# Patient Record
Sex: Female | Born: 1980 | Race: Asian | Hispanic: No | Marital: Married | Smoking: Never smoker
Health system: Southern US, Community
[De-identification: ages and names within clinical notes are randomized; demographics above are authoritative.]

## PROBLEM LIST (undated history)

## (undated) DIAGNOSIS — R87629 Unspecified abnormal cytological findings in specimens from vagina: Secondary | ICD-10-CM

## (undated) HISTORY — PX: NOSE SURGERY: SHX723

## (undated) HISTORY — PX: BREAST ENHANCEMENT SURGERY: SHX7

## (undated) HISTORY — DX: Unspecified abnormal cytological findings in specimens from vagina: R87.629

## (undated) HISTORY — PX: BREAST SURGERY: SHX581

---

## 2007-03-03 ENCOUNTER — Ambulatory Visit: Payer: Self-pay | Admitting: Nurse Practitioner

## 2007-03-03 DIAGNOSIS — J330 Polyp of nasal cavity: Secondary | ICD-10-CM

## 2007-04-04 ENCOUNTER — Ambulatory Visit: Payer: Self-pay | Admitting: Nurse Practitioner

## 2007-04-04 DIAGNOSIS — N3000 Acute cystitis without hematuria: Secondary | ICD-10-CM | POA: Insufficient documentation

## 2007-04-04 LAB — CONVERTED CEMR LAB
Blood in Urine, dipstick: NEGATIVE
Ketones, urine, test strip: NEGATIVE
Nitrite: NEGATIVE
Protein, U semiquant: NEGATIVE
Specific Gravity, Urine: 1.015
Urobilinogen, UA: 0.2
WBC Urine, dipstick: NEGATIVE

## 2007-04-05 ENCOUNTER — Encounter (INDEPENDENT_AMBULATORY_CARE_PROVIDER_SITE_OTHER): Payer: Self-pay | Admitting: Nurse Practitioner

## 2007-04-11 ENCOUNTER — Other Ambulatory Visit: Admission: RE | Admit: 2007-04-11 | Discharge: 2007-04-11 | Payer: Self-pay | Admitting: Family Medicine

## 2007-04-11 ENCOUNTER — Ambulatory Visit: Payer: Self-pay | Admitting: Nurse Practitioner

## 2007-04-11 DIAGNOSIS — J309 Allergic rhinitis, unspecified: Secondary | ICD-10-CM | POA: Insufficient documentation

## 2007-04-11 DIAGNOSIS — L708 Other acne: Secondary | ICD-10-CM | POA: Insufficient documentation

## 2007-04-11 LAB — CONVERTED CEMR LAB
Bilirubin Urine: NEGATIVE
Ketones, urine, test strip: NEGATIVE
Nitrite: NEGATIVE
Pap Smear: NORMAL
Protein, U semiquant: NEGATIVE
Specific Gravity, Urine: 1.015

## 2007-04-13 DIAGNOSIS — E7800 Pure hypercholesterolemia, unspecified: Secondary | ICD-10-CM

## 2007-04-13 LAB — CONVERTED CEMR LAB
ALT: 10 units/L (ref 0–35)
Alkaline Phosphatase: 65 units/L (ref 39–117)
BUN: 12 mg/dL (ref 6–23)
Basophils Relative: 1 % (ref 0–1)
Chloride: 101 meq/L (ref 96–112)
Cholesterol: 230 mg/dL — ABNORMAL HIGH (ref 0–200)
Creatinine, Ser: 0.69 mg/dL (ref 0.40–1.20)
HCT: 40.8 % (ref 36.0–46.0)
HDL: 83 mg/dL (ref >39)
Hemoglobin: 12.8 g/dL (ref 12.0–15.0)
LDL Cholesterol: 129 mg/dL — ABNORMAL HIGH (ref 0–99)
Lymphocytes Relative: 32 % (ref 12–46)
Lymphs Abs: 1.7 10*3/uL (ref 0.7–4.0)
MCHC: 31.4 g/dL (ref 30.0–36.0)
MCV: 86.1 fL (ref 78.0–100.0)
Neutro Abs: 2.8 10*3/uL (ref 1.7–7.7)
Neutrophils Relative %: 55 % (ref 43–77)
Potassium: 4.4 meq/L (ref 3.5–5.3)
RBC: 4.74 M/uL (ref 3.87–5.11)
TSH: 1.141 microintl units/mL (ref 0.350–5.50)
Total Bilirubin: 0.5 mg/dL (ref 0.3–1.2)
Triglycerides: 92 mg/dL (ref <150)
WBC: 5.2 10*3/uL (ref 4.0–10.5)

## 2007-04-19 ENCOUNTER — Encounter (INDEPENDENT_AMBULATORY_CARE_PROVIDER_SITE_OTHER): Payer: Self-pay | Admitting: Nurse Practitioner

## 2007-05-18 ENCOUNTER — Ambulatory Visit: Payer: Self-pay | Admitting: Nurse Practitioner

## 2007-05-18 LAB — CONVERTED CEMR LAB
Bilirubin Urine: NEGATIVE
Protein, U semiquant: NEGATIVE
pH: 7.5

## 2007-06-27 ENCOUNTER — Telehealth (INDEPENDENT_AMBULATORY_CARE_PROVIDER_SITE_OTHER): Payer: Self-pay | Admitting: Nurse Practitioner

## 2009-06-17 ENCOUNTER — Inpatient Hospital Stay (HOSPITAL_COMMUNITY): Admission: AD | Admit: 2009-06-17 | Discharge: 2009-06-17 | Payer: Self-pay | Admitting: Obstetrics and Gynecology

## 2009-08-13 ENCOUNTER — Inpatient Hospital Stay (HOSPITAL_COMMUNITY): Admission: AD | Admit: 2009-08-13 | Discharge: 2009-08-13 | Payer: Self-pay | Admitting: Obstetrics & Gynecology

## 2009-09-01 ENCOUNTER — Ambulatory Visit (HOSPITAL_COMMUNITY): Admission: RE | Admit: 2009-09-01 | Discharge: 2009-09-01 | Payer: Self-pay | Admitting: Family Medicine

## 2009-09-01 ENCOUNTER — Encounter: Payer: Self-pay | Admitting: Family Medicine

## 2009-09-20 ENCOUNTER — Inpatient Hospital Stay (HOSPITAL_COMMUNITY): Admission: AD | Admit: 2009-09-20 | Discharge: 2009-09-20 | Payer: Self-pay | Admitting: Obstetrics and Gynecology

## 2010-02-04 ENCOUNTER — Ambulatory Visit: Payer: Self-pay | Admitting: Obstetrics and Gynecology

## 2010-02-08 ENCOUNTER — Ambulatory Visit: Payer: Self-pay | Admitting: Obstetrics and Gynecology

## 2010-02-08 ENCOUNTER — Inpatient Hospital Stay (HOSPITAL_COMMUNITY): Admission: AD | Admit: 2010-02-08 | Discharge: 2010-02-10 | Payer: Self-pay | Admitting: Obstetrics & Gynecology

## 2010-06-09 NOTE — Progress Notes (Signed)
Summary: ENT referral  Phone Note Call from Patient   Reason for Call: Referral Summary of Call: Pt want a [facility] for Ear,Nose & Throat  Specialist she went to [facility] and she said that she wants [employer] # [phone] and they need [facility] Please, call her at [phone]  Thank You . Initial call taken by: Cheryll Dessert,  June 27, 2007 10:35 AM  Follow-up for Phone Call        Pt has to go where medicaid is accepted.  no notes in chart regarding treatment at ENT. Why does she feel necessary to go to another ENT? Follow-up by: Lehman Prom FNP,  June 28, 2007 5:32 PM  Additional Follow-up for Phone Call Additional follow up Details #1::        left message for pt to return call to office Additional Follow-up by: Levon Hedger,  June 29, 2007 12:16 PM    Additional Follow-up for Phone Call Additional follow up Details #2::    spoke with pt sister because she does not speak English she said she went to her appt and they would not see her because she did not have an interpreter so they rescheduled her appt and she went again without an interpreter and she got the phone number to another office and they told them they take medicaid and all she need was an order from a physician.  She gave me the [phone] on [address] I told her I would give her a call on Monday after I got this information to her provider. Follow-up by: Levon Hedger,  June 29, 2007 3:30 PM  Additional Follow-up for Phone Call Additional follow up Details #3:: Details for Additional Follow-up Action Taken: new referral printed  06/30/07 4:55pm spoke with pt sister she is aware of the new appt date and time  Additional Follow-up by: Lehman Prom FNP,  June 29, 2007 3:55 PM

## 2010-07-23 LAB — CBC
Hemoglobin: 10.8 g/dL — ABNORMAL LOW (ref 12.0–15.0)
Hemoglobin: 12.1 g/dL (ref 12.0–15.0)
MCH: 28.7 pg (ref 26.0–34.0)
MCH: 29.7 pg (ref 26.0–34.0)
MCHC: 33.4 g/dL (ref 30.0–36.0)
MCV: 87.3 fL (ref 78.0–100.0)
RBC: 3.64 MIL/uL — ABNORMAL LOW (ref 3.87–5.11)
RDW: 14.1 % (ref 11.5–15.5)
WBC: 11.8 10*3/uL — ABNORMAL HIGH (ref 4.0–10.5)
WBC: 6.8 10*3/uL (ref 4.0–10.5)

## 2010-07-23 LAB — RPR: RPR Ser Ql: NONREACTIVE

## 2010-07-28 LAB — CBC
Platelets: 226 10*3/uL (ref 150–400)
RDW: 13.7 % (ref 11.5–15.5)

## 2010-07-30 LAB — URINALYSIS, ROUTINE W REFLEX MICROSCOPIC
Ketones, ur: NEGATIVE mg/dL
Nitrite: NEGATIVE
Protein, ur: NEGATIVE mg/dL
Urobilinogen, UA: 0.2 mg/dL (ref 0.0–1.0)
pH: 7.5 (ref 5.0–8.0)

## 2010-07-30 LAB — CBC
HCT: 31.8 % — ABNORMAL LOW (ref 36.0–46.0)
Hemoglobin: 11 g/dL — ABNORMAL LOW (ref 12.0–15.0)
MCHC: 34.7 g/dL (ref 30.0–36.0)
MCV: 86.2 fL (ref 78.0–100.0)
Platelets: 188 10*3/uL (ref 150–400)
RBC: 3.69 MIL/uL — ABNORMAL LOW (ref 3.87–5.11)
RDW: 12.4 % (ref 11.5–15.5)

## 2010-07-30 LAB — WET PREP, GENITAL: Yeast Wet Prep HPF POC: NONE SEEN

## 2010-07-30 LAB — URINE MICROSCOPIC-ADD ON

## 2010-07-30 LAB — URINE CULTURE

## 2011-08-07 IMAGING — US US OB TRANSVAGINAL MODIFY
1 series · 13 of 28 positions shown · non-contrast
Comparison: None.

CLINICAL DATA: Pelvic pain.

OBSTETRIC <14 WK US AND TRANSVAGINAL OB US
TECHNIQUE: Both transabdominal and transvaginal ultrasound
examinations were performed for complete evaluation of the
gestation as well as the maternal uterus, adnexal regions, and
pelvic cul-de-sac.

[Series 1: us ob comp less 14 wks · 0.20mm/px · 13 of 39 slices shown]
[im 2/39]
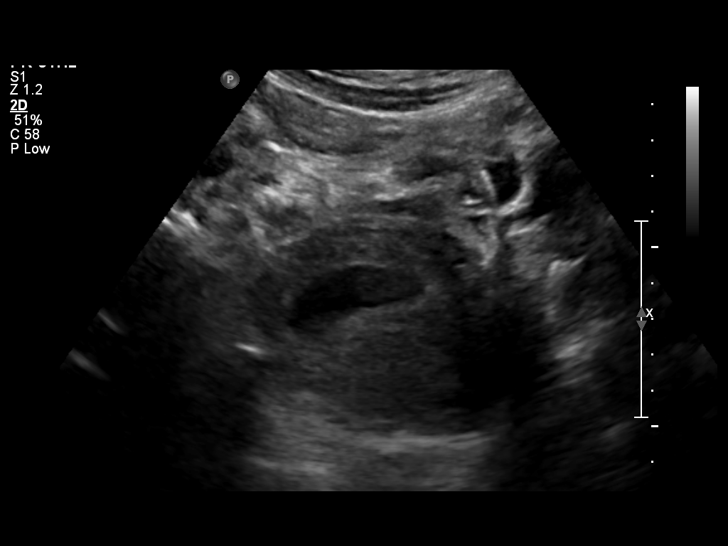
[im 5/39]
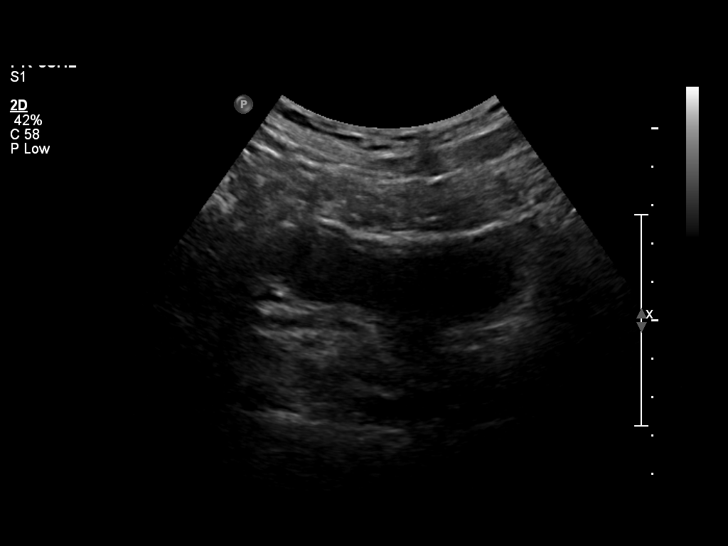
[im 8/39]
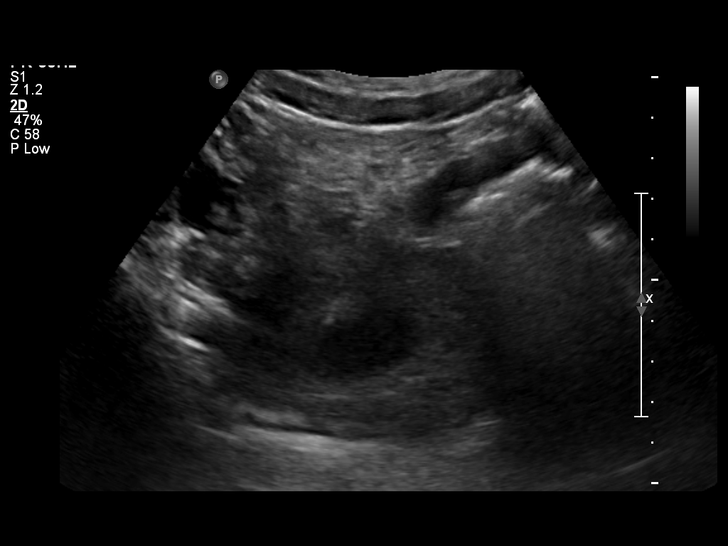
[im 10/39]
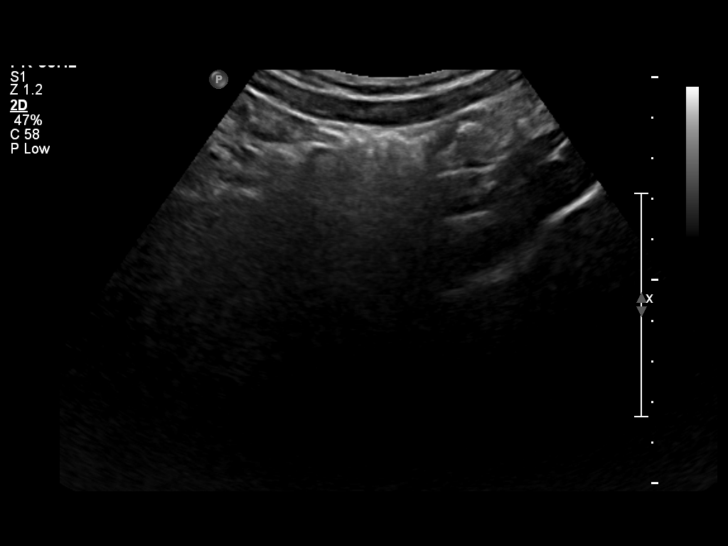
[im 13/39]
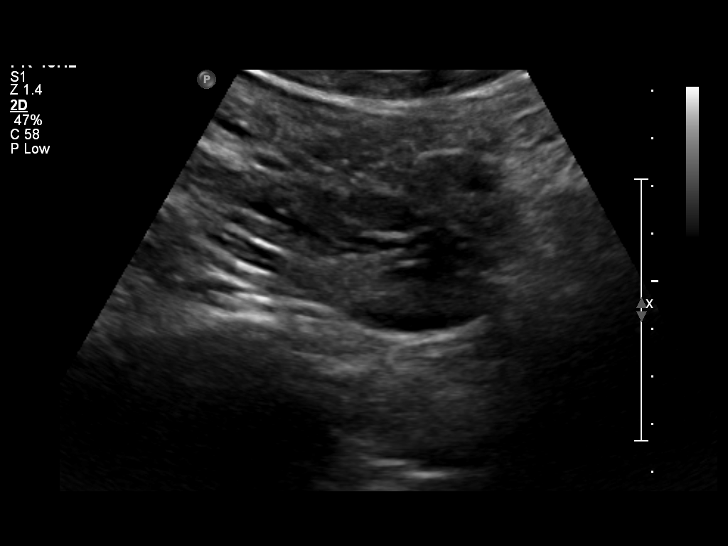
[im 16/39]
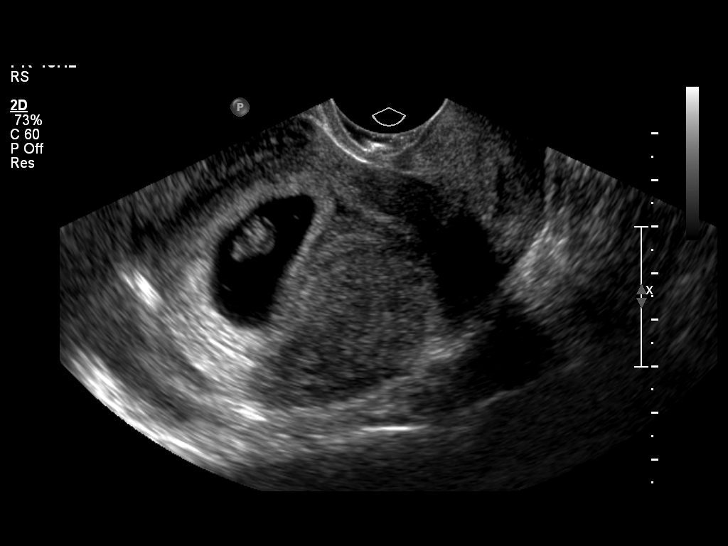
[im 20/39]
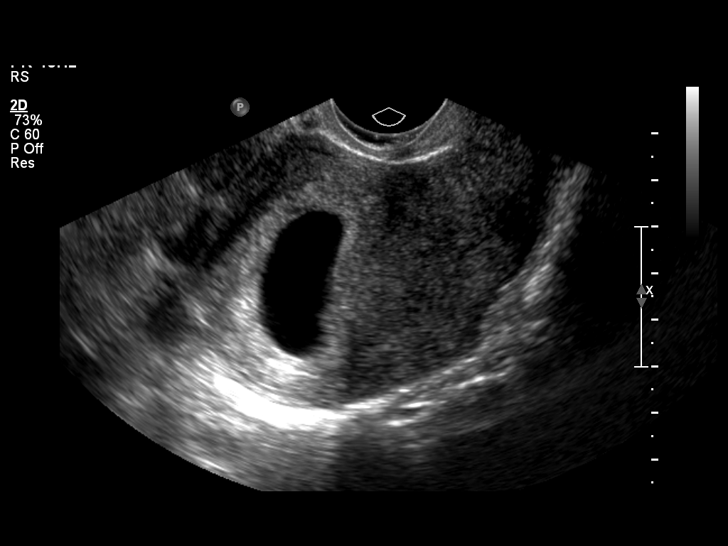
[im 23/39]
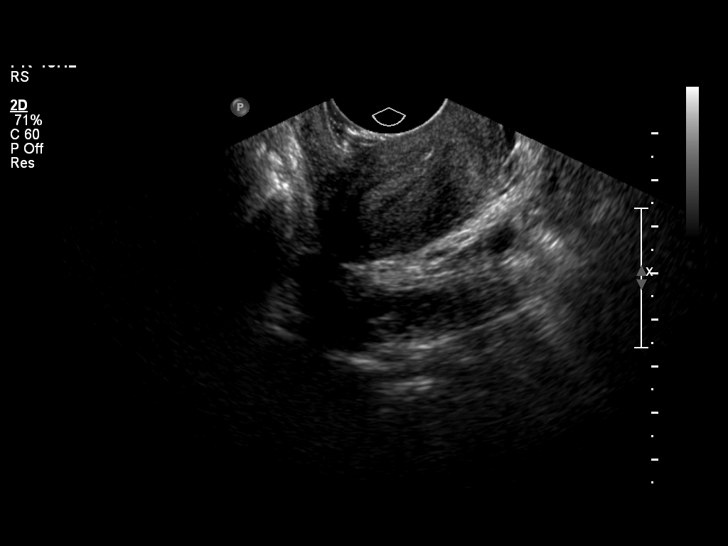
[im 26/39]
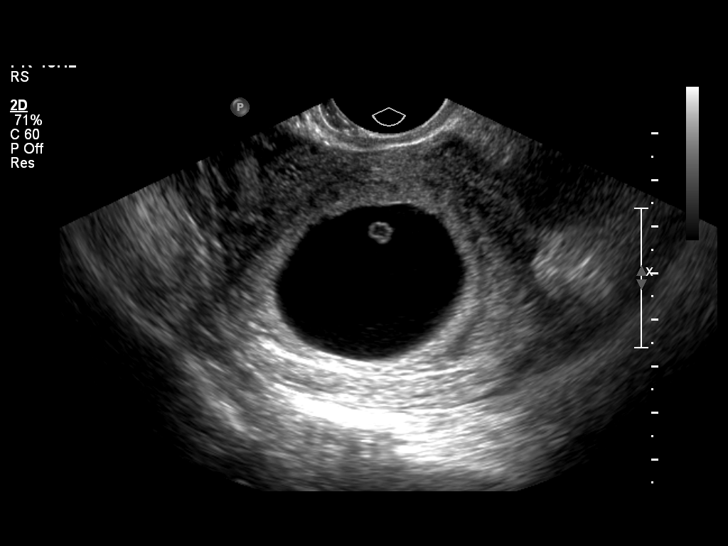
[im 29/39]
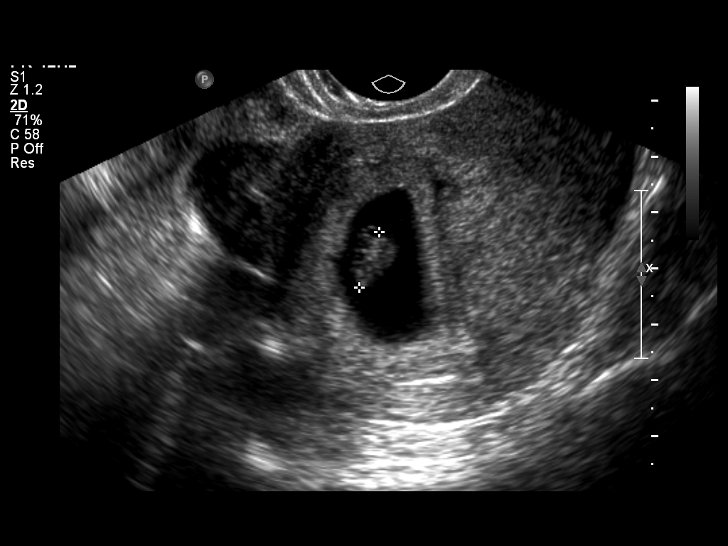
[im 31/39]
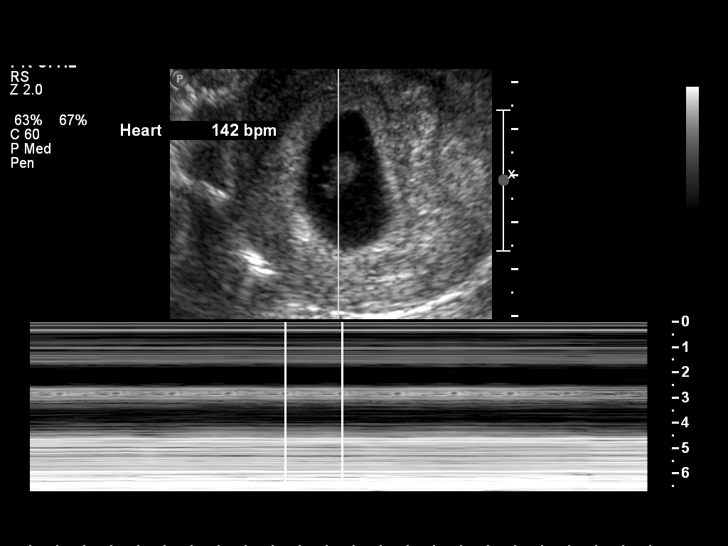
[im 34/39]
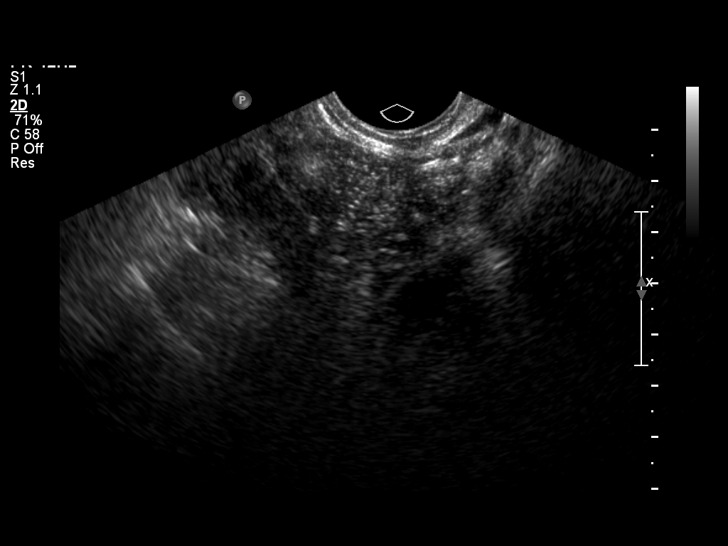
[im 37/39]
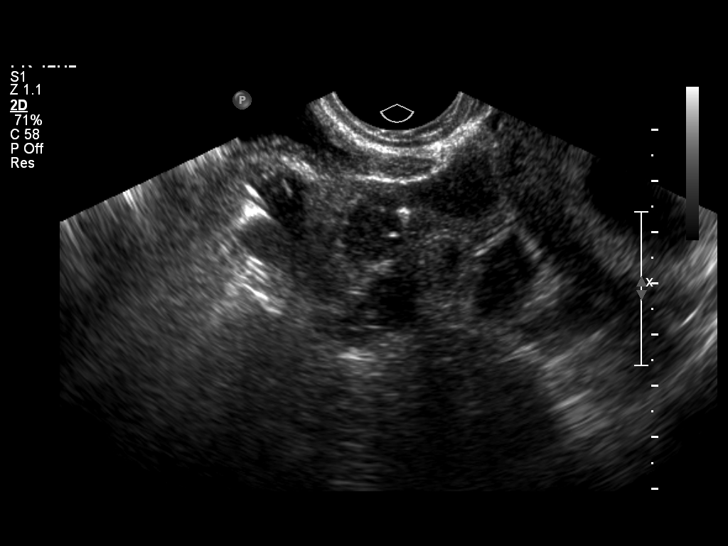

[13 of 28 positions shown; findings below may reference images not displayed]

FINDINGS: A single intrauterine gestational sac is noted.  The embryo has a
crown-rump length of 1.05 cm, corresponding to a gestational age of
7 weeks 2 days.  A yolk sac is visualized.  Cardiac activity is
seen.  The measured heart rate is 142 beats per minute.  No
subchorionic hemorrhage is appreciated.

The uterus is otherwise unremarkable in appearance.  Myometrial
echogenicity is within normal limits.  No fibroids are seen.

The right ovary is unremarkable in appearance.  It measures 2.6 x
1.4 x 1.9 cm.  The left ovary is not characterized.  No suspicious
adnexal masses are seen.

No free fluid is seen within the pelvic cul-de-sac.
IMPRESSION: Single live intrauterine pregnancy, with a crown-rump length of
1.05 cm, corresponding to a gestational age of 7 weeks 2 days.
This exactly matches the patient's LMP, and reflects an estimated
date of delivery February 01, 2010.

## 2011-10-22 IMAGING — US US OB COMP +14 WK
1 series · 14 of 28 positions shown · non-contrast
Comparison: none

OBSTETRICAL ULTRASOUND:
 This ultrasound exam was performed in the [HOSPITAL] Ultrasound Department.  The OB US report was generated in the AS system, and faxed to the ordering physician.  This report is also available in [HOSPITAL]?s [employer] and in [REDACTED] PACS.

[Series 1: us ob comp +14 wk · 14 of 68 slices shown]
[im 3/68]
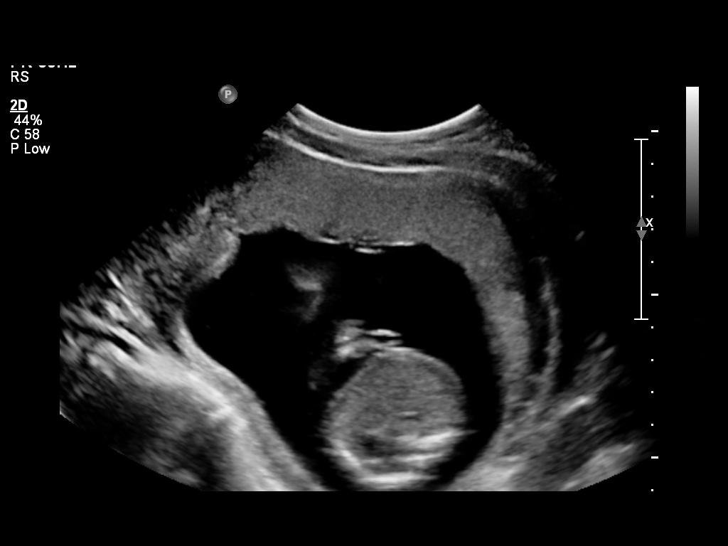
[im 8/68]
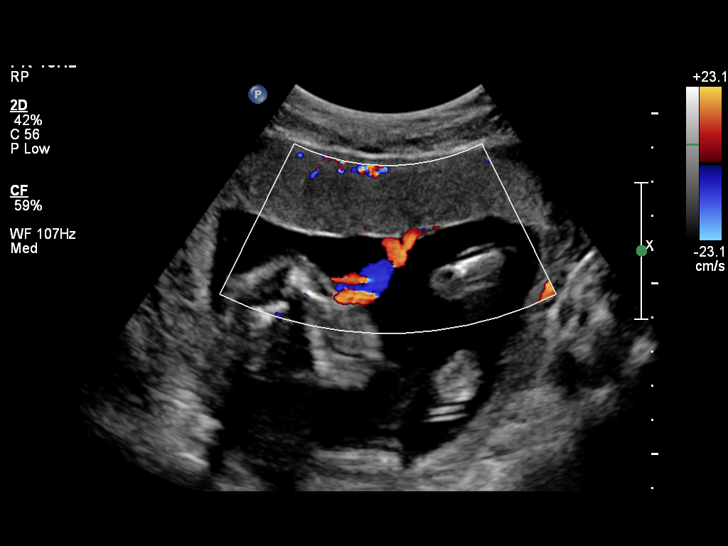
[im 13/68]
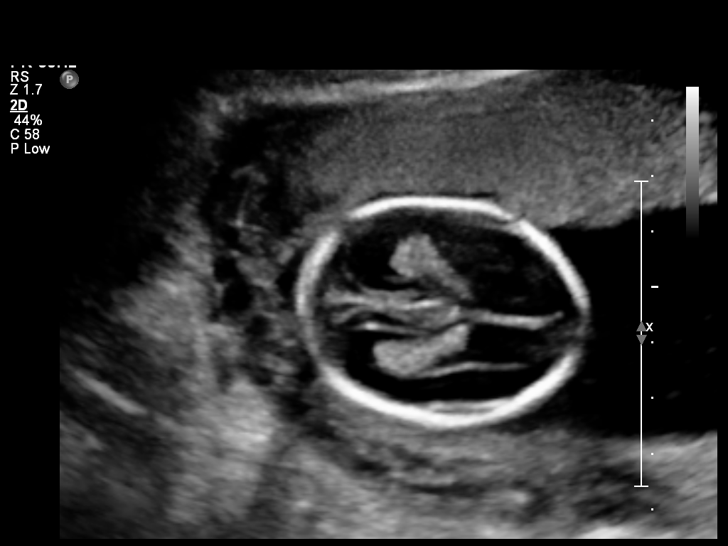
[im 18/68]
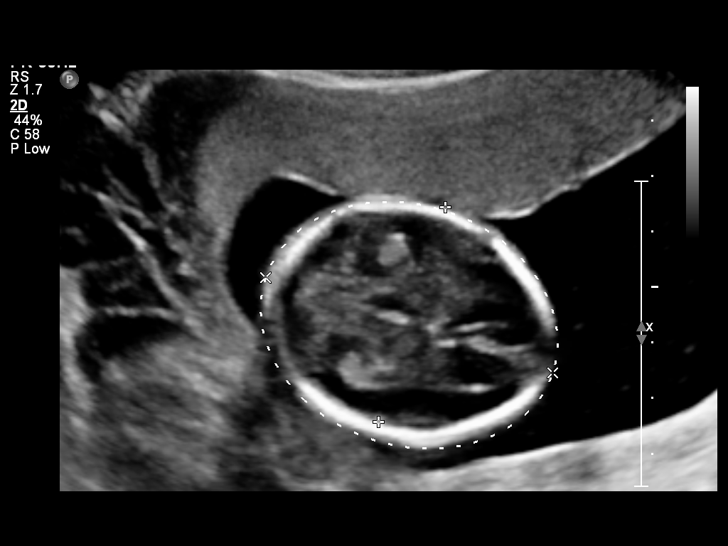
[im 23/68]
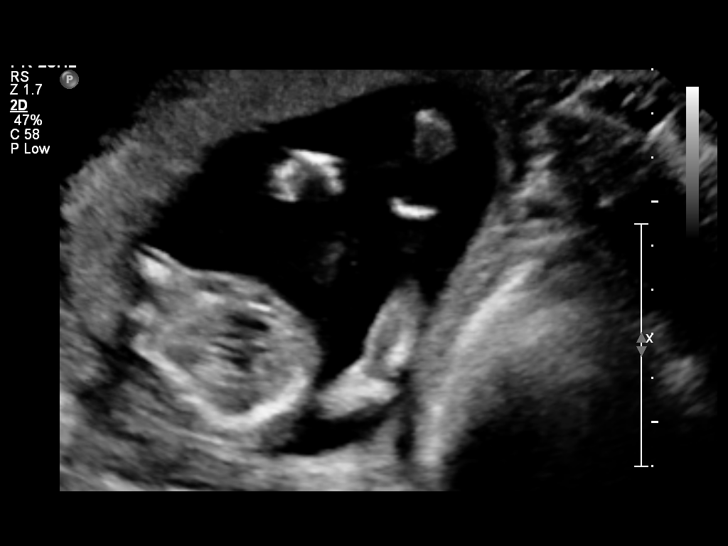
[im 28/68]
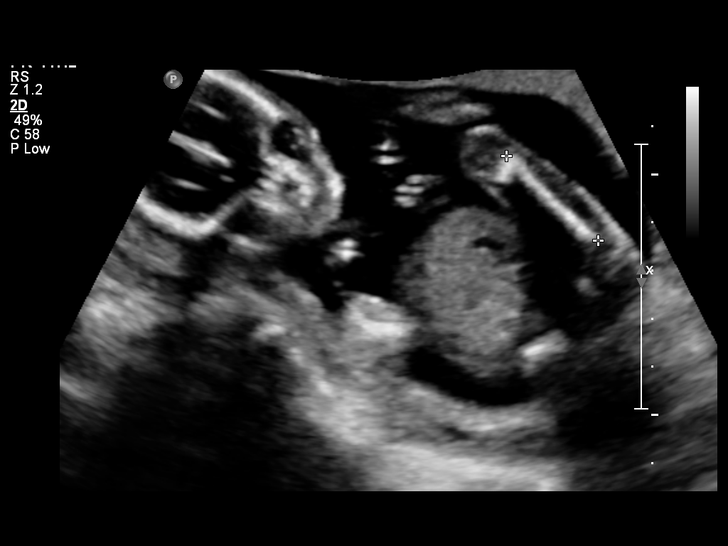
[im 33/68]
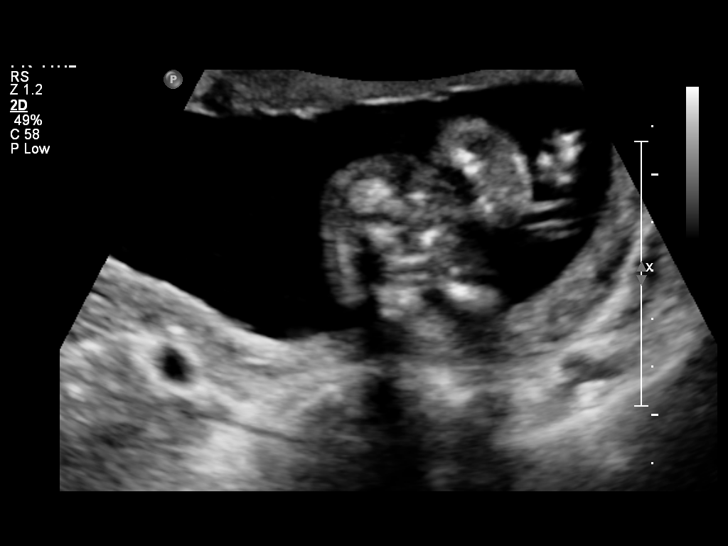
[im 38/68]
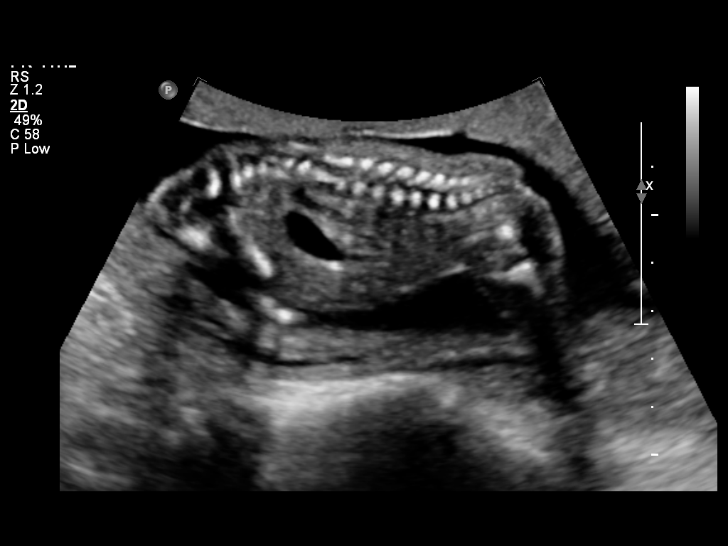
[im 43/68]
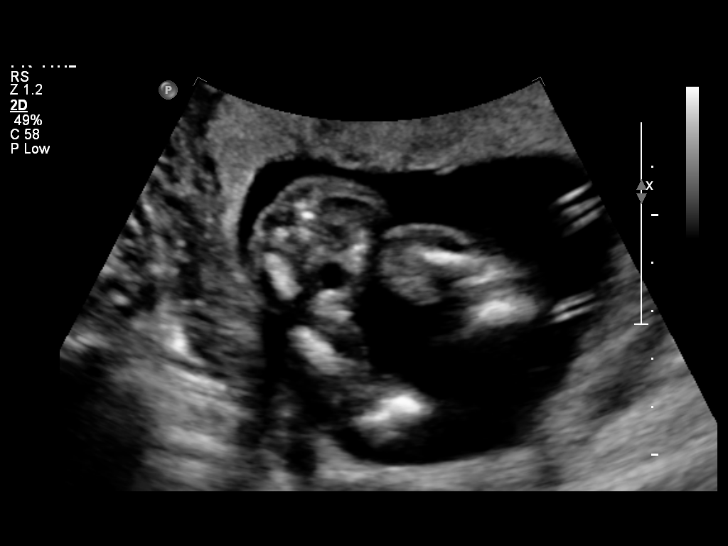
[im 48/68]
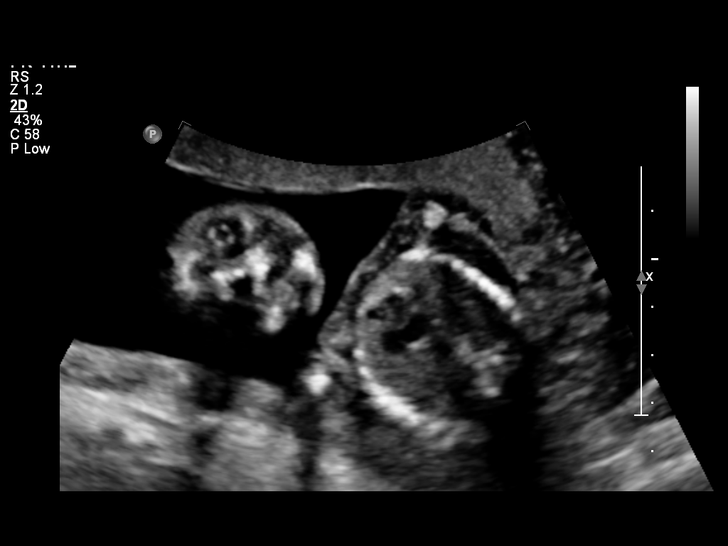
[im 53/68]
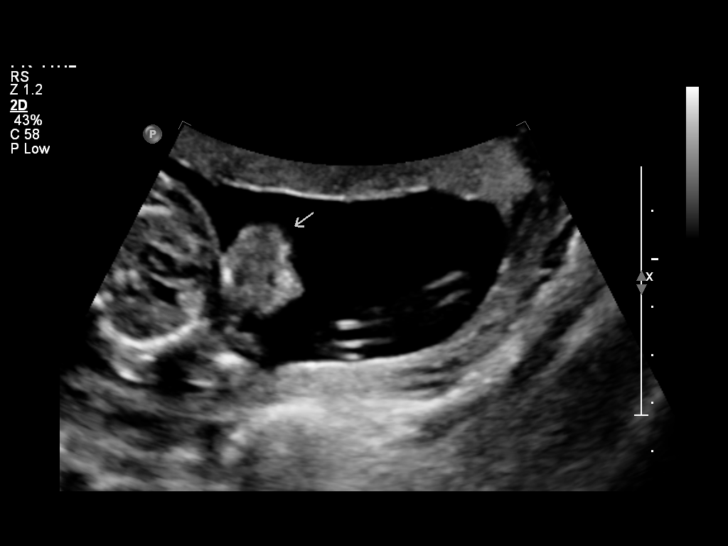
[im 58/68]
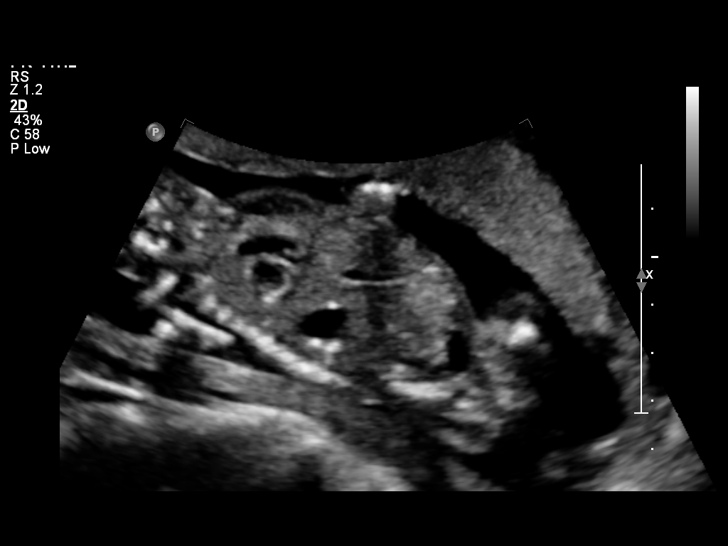
[im 63/68]
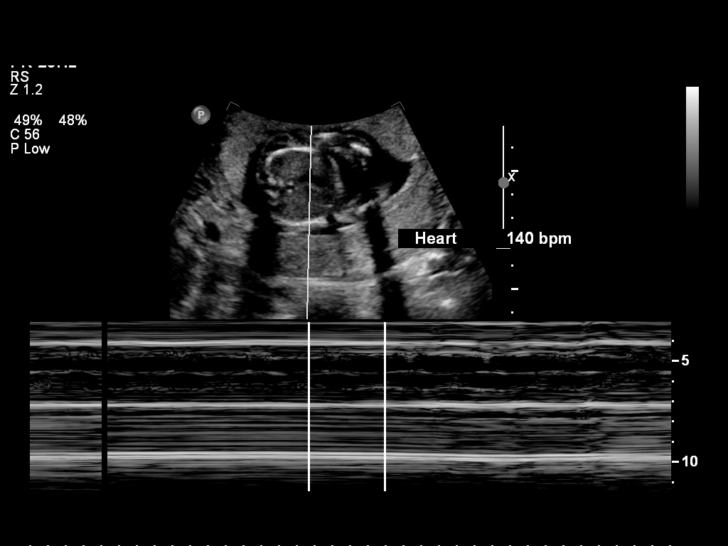
[im 68/68]
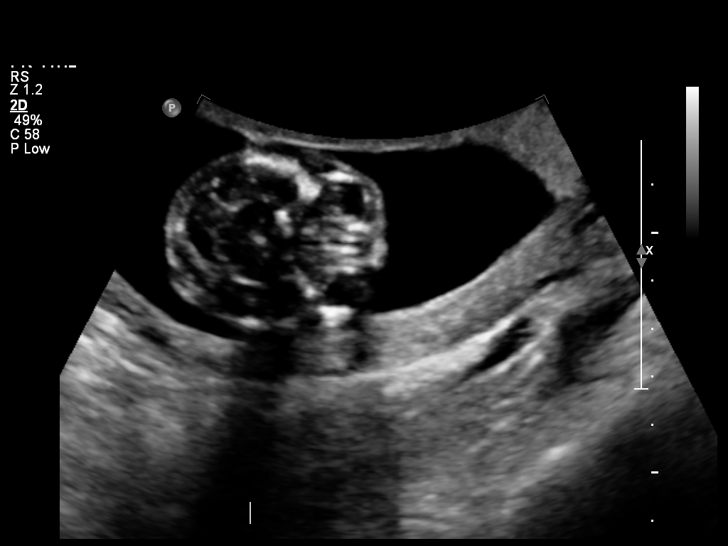

[14 of 28 positions shown; findings below may reference images not displayed]

IMPRESSION: See [facility] Obstetric US report.

## 2013-11-13 ENCOUNTER — Ambulatory Visit: Payer: Self-pay | Admitting: Family Medicine

## 2014-01-28 ENCOUNTER — Ambulatory Visit: Payer: Self-pay

## 2015-08-25 ENCOUNTER — Emergency Department
Admission: EM | Admit: 2015-08-25 | Discharge: 2015-08-25 | Disposition: A | Discharge status: Home / Self Care | Payer: BLUE CROSS/BLUE SHIELD | Attending: Emergency Medicine | Admitting: Emergency Medicine

## 2015-08-25 ENCOUNTER — Encounter: Payer: Self-pay | Admitting: Emergency Medicine

## 2015-08-25 DIAGNOSIS — K5903 Drug induced constipation: Secondary | ICD-10-CM | POA: Insufficient documentation

## 2015-08-25 DIAGNOSIS — T402X5A Adverse effect of other opioids, initial encounter: Secondary | ICD-10-CM

## 2015-08-25 DIAGNOSIS — K5900 Constipation, unspecified: Secondary | ICD-10-CM | POA: Diagnosis present

## 2015-08-25 MED ORDER — LACTULOSE 20 G PO PACK: 20.0000 g | PACK | Freq: Three times a day (TID) | ORAL | Status: DC

## 2015-08-25 MED ORDER — MINERAL OIL RE ENEM
1.0000 | ENEMA | Freq: Once | RECTAL | Status: AC
  Administered 2015-08-25: 1 via RECTAL

## 2015-08-25 NOTE — ED Provider Notes (Addendum)
[facility] Emergency Department Provider Note  ____________________________________________   I have reviewed the triage vital signs and the nursing notes.   HISTORY  Chief Complaint Constipation    HPI Deanna Hutchinson is a 35 y.o. female presents today complaining of abdominal discomfort and constipation. Patient has been on narcotic pain medication since her breast augmentation surgery last week. Has not been taking any stool softeners. Has not had a bowel movement since she started the pain medication. She took her last pain medication yesterday. Tried MiraLAX this morning with no results. Patient has cramping and feels constipated. She denies pregnancy. No vomiting, no fever, states that she has had a follow-up appointment for her breast surgery and that everything is fine there. She has no ongoing breast pain and no other complaints of that variety.     History reviewed. No pertinent past medical history.  Patient Active Problem List   Diagnosis Date Noted  . HYPERCHOLESTEROLEMIA, MILD 04/13/2007  . ALLERGIC RHINITIS 04/11/2007  . 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 ACUTE CYSTITIS 04/04/2007  . 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 Breast surgery      No current outpatient prescriptions on file.  Allergies Review of patient's allergies indicates no known allergies.  History reviewed. No pertinent family history.  Social History Social History  Substance Use Topics  . Smoking status: Never Smoker   . Smokeless tobacco: None  . Alcohol Use: No    Review of Systems Constitutional: No fever/chills Eyes: No visual changes. ENT: No sore throat. No stiff neck no neck pain Cardiovascular: Denies chest pain. Respiratory: Denies shortness of breath. Gastrointestinal:   no vomiting.  No diarrhea.  Positive constipation. Genitourinary: Negative for dysuria. Musculoskeletal: Negative lower extremity swelling Skin:  Negative for rash. Neurological: Negative for headaches, focal weakness or numbness. 10-point ROS otherwise negative.  ____________________________________________   PHYSICAL EXAM:  VITAL SIGNS: ED Triage Vitals  Enc Vitals Group     BP 08/25/15 1407 116/80 mmHg     Pulse Rate 08/25/15 1407 88     Resp 08/25/15 1407 18     Temp 08/25/15 1407 97.7 F (36.5 C)     Temp Source 08/25/15 1407 Oral     SpO2 08/25/15 1407 98 %     Weight 08/25/15 1407 133 lb (60.328 kg)     Height 08/25/15 1407 5\' 3"  (1.6 m)     Head Cir --      Peak Flow --      Pain Score 08/25/15 1409 8     Pain Loc --      Pain Edu? --      Excl. in GC? --     Constitutional: Alert and oriented. Well appearing and in no acute distress. Appears slightly uncomfortable Eyes: Conjunctivae are normal. Mouth/Throat: Mucous membranes are moist.  Oropharynx non-erythematous. Cardiovascular: Normal rate, regular rhythm. Grossly normal heart sounds.  Good peripheral circulation. Respiratory: Normal respiratory effort.  No retractions. Lungs CTAB. Abdominal: Soft and nontender. No distention. No guarding no rebound Back:  There is no focal tenderness or step off there is no midline tenderness there are no lesions noted. there is no CVA tenderness Musculoskeletal: No lower extremity tenderness.  Neurologic:  Normal speech and language. No gross focal neurologic deficits are appreciated.  Skin:  Skin is warm, dry and intact. No rash noted. Psychiatric: Mood and affect are normal. Speech and behavior are normal.  ____________________________________________   LABS (all  labs ordered are listed, but only abnormal results are displayed)  Labs Reviewed - No data to display ____________________________________________  EKG  I personally interpreted any EKGs ordered by me or triage  ____________________________________________  RADIOLOGY  I reviewed any imaging ordered by me or triage that were performed during my  shift and, if possible, patient and/or family made aware of any abnormal findings. ____________________________________________   PROCEDURES  Procedure(s) performed: None  Critical Care performed: None  ____________________________________________   INITIAL IMPRESSION / ASSESSMENT AND PLAN / ED COURSE  Pertinent labs & imaging results that were available during my care of the patient were reviewed by me and considered in my medical decision making (see chart for details).  Patient complaining of constipation, has not had a bowel movement in a week, abdomen is benign no evidence of obstruction or surgical abdomen, no vomiting, we will give her an enema at her request and see if this helps her. No warning signs for other acute intra-abdominal pathology by history or exam at this time. Patient denies pregnancy.  /b ----------------------------------------- 4:41 PM on 08/25/2015 -----------------------------------------  Patient had a large bowel movement, nonbloody, abdomen remains completely benign she feels much better and is requesting discharge. He will continue this at home. Again no evidence of obstruction, patient will be given medication to help her relieve her opiate-induced constipation. No further workup indicated extensive return precautions given and understood ____________________________________________   FINAL CLINICAL IMPRESSION(S) / ED DIAGNOSES  Final diagnoses:  None      This chart was dictated using voice recognition software.  Despite best efforts to proofread,  errors can occur which can change meaning.    Jeanmarie Plant, MD 08/25/15 0981  Jeanmarie Plant, MD 08/25/15 [phone]

## 2015-08-25 NOTE — ED Notes (Signed)
Pt presents to ED with c/o constipation that started hurting Saturday. Last bowel movement Monday; recent breast surgery Tuesday and is on pain meds. Pt states her abdomen is bloated.

## 2015-10-21 DIAGNOSIS — N750 Cyst of Bartholin's gland: Secondary | ICD-10-CM | POA: Insufficient documentation

## 2018-01-18 ENCOUNTER — Ambulatory Visit (INDEPENDENT_AMBULATORY_CARE_PROVIDER_SITE_OTHER): Payer: BLUE CROSS/BLUE SHIELD

## 2018-01-18 ENCOUNTER — Other Ambulatory Visit: Payer: Self-pay | Admitting: Certified Nurse Midwife

## 2018-01-18 ENCOUNTER — Ambulatory Visit (INDEPENDENT_AMBULATORY_CARE_PROVIDER_SITE_OTHER): Payer: BLUE CROSS/BLUE SHIELD | Admitting: Certified Nurse Midwife

## 2018-01-18 DIAGNOSIS — N926 Irregular menstruation, unspecified: Secondary | ICD-10-CM | POA: Diagnosis not present

## 2018-01-18 DIAGNOSIS — Z3201 Encounter for pregnancy test, result positive: Secondary | ICD-10-CM

## 2018-01-18 DIAGNOSIS — Z3A01 Less than 8 weeks gestation of pregnancy: Secondary | ICD-10-CM

## 2018-01-18 DIAGNOSIS — N8312 Corpus luteum cyst of left ovary: Secondary | ICD-10-CM

## 2018-01-18 DIAGNOSIS — O3411 Maternal care for benign tumor of corpus uteri, first trimester: Secondary | ICD-10-CM

## 2018-01-18 LAB — POCT URINE PREGNANCY: Preg Test, Ur: POSITIVE — AB

## 2018-01-18 NOTE — Patient Instructions (Signed)
Exercise During Pregnancy For people of all ages, exercise is an important part of being healthy. Exercise improves heart and lung function and helps to maintain strength, flexibility, and a healthy body weight. Exercise also boosts energy levels and elevates mood. For most women, maintaining an exercise routine throughout pregnancy is recommended. It is only on rare occasions and with certain medical conditions or pregnancy complications that women may be asked to limit or avoid exercise during pregnancy. What are some other benefits to exercising during pregnancy? Along with maintaining strength and flexibility, exercising throughout pregnancy can help to:  Keep strength in muscles that are very important during labor and childbirth.  Decrease low back pain during pregnancy.  Decrease the risk of developing gestational diabetes mellitus (GDM).  Improve blood sugar (glucose) control for women who have GDM.  Decrease the risk of developing preeclampsia. This is a serious condition that causes high blood pressure along with other symptoms, such as swelling and headaches.  Decrease the risk of cesarean delivery.  Speed up the recovery after giving birth.  How often should I exercise? Unless your health care provider gives you different instructions, you should try to exercise on most days or all days of the week. In general, try to exercise with moderate intensity for about 150 minutes per week. This can be spread out across several days, such as exercising for 30 minutes per day on 5 days of each week. You can tell that you are exercising at a moderate intensity if you have a higher heart rate and faster breathing, but you are still able to hold a conversation. What types of moderate-intensity exercise are recommended during pregnancy? There are many types of exercise that are safe for you to do during pregnancy. Unless your health care provider gives you different instructions, do a variety of  exercises that safely increase your heart and breathing (cardiopulmonary) rates and help you to build and maintain muscle strength (strength training). You should always be able to talk in full sentences while exercising during pregnancy. Some examples of exercising that is safe to do during pregnancy include:  Brisk walking or hiking.  Swimming.  Water aerobics.  Riding a stationary bike.  Strength training.  Modified yoga or Pilates. Tell your instructor that you are pregnant. Avoid overstretching and avoid lying on your back for long periods of time.  Running or jogging. Only choose this type of exercise if: ? You ran or jogged regularly before your pregnancy. ? You can run or jog and still talk in complete sentences.  What types of exercise should I not do during pregnancy? Depending on your level of fitness and whether you exercised regularly before your pregnancy, you may be advised to limit vigorous-intensity exercise during your pregnancy. You can tell that you are exercising at a vigorous intensity if you are breathing much harder and faster and cannot hold a conversation while exercising. Some examples of exercising that you should avoid during pregnancy include:  Contact sports.  Activities that place you at risk for falling on or being hit in the belly, such as downhill skiing, water skiing, surfing, rock climbing, cycling, gymnastics, and horseback riding.  Scuba diving.  Sky diving.  Yoga or Pilates in a room that is heated to extreme temperatures ("hot yoga" or "hot Pilates").  Jogging or running, unless you ran or jogged regularly before your pregnancy. While jogging or running, you should always be able to talk in full sentences. Do not run or jog so vigorously  that you are unable to have a conversation.  If you are not used to exercising at elevation (more than 6,000 feet above sea level), do not do so during your pregnancy.  When should I avoid exercising  during pregnancy? Certain medical conditions can make it unsafe to exercise during pregnancy, or they may increase your risk of miscarriage or early labor and birth. Some of these conditions include:  Some types of heart disease.  Some types of lung disease.  Placenta previa. This is when the placenta partially or completely covers the opening of the uterus (cervix).  Frequent bleeding from the vagina during your pregnancy.  Incompetent cervix. This is when your cervix does not remain as tightly closed during pregnancy as it should.  Premature labor.  Ruptured membranes. This is when the protective sac (amniotic sac) opens up and amniotic fluid leaks from your vagina.  Severely low blood count (anemia).  Preeclampsia or pregnancy-caused high blood pressure.  Carrying more than one baby (multiple gestation) and having an additional risk of early labor.  Poorly controlled diabetes.  Being severely underweight or severely overweight.  Intrauterine growth restriction. This is when your baby's growth and development during pregnancy are slower than expected.  Other medical conditions. Ask your health care provider if any apply to you.  What else should I know about exercising during pregnancy? You should take these precautions while exercising during pregnancy:  Avoid overheating. ? Wear loose-fitting, breathable clothes. ? Do not exercise in very high temperatures.  Avoid dehydration. Drink enough water before, during, and after exercise to keep your urine clear or pale yellow.  Avoid overstretching. Because of hormone changes during pregnancy, it is easy to overstretch muscles, tendons, and ligaments during pregnancy.  Start slowly and ask your health care provider to recommend types of exercise that are safe for you, if exercising regularly is new for you.  Pregnancy is not a time for exercising to lose weight. When should I seek medical care? You should stop exercising  and call your health care provider if you have any unusual symptoms, such as:  Mild uterine contractions or abdominal cramping.  Dizziness that does not improve with rest.  When should I seek immediate medical care? You should stop exercising and call your local emergency services (911 in the [place] if you have any unusual symptoms, such as:  Sudden, severe pain in your low back or your belly.  Uterine contractions or abdominal cramping that do not improve with rest.  Chest pain.  Bleeding or fluid leaking from your vagina.  Shortness of breath.  This information is not intended to replace advice given to you by your health care provider. Make sure you discuss any questions you have with your health care provider. Document Released: 04/26/2005 Document Revised: 09/24/2015 Document Reviewed: 07/04/2014 Elsevier Interactive Patient Education  2018 [employer]. Common Medications Safe in Pregnancy  Acne:      Constipation:  Benzoyl Peroxide     Colace  Clindamycin      Dulcolax Suppository  Topica Erythromycin     Fibercon  Salicylic Acid      Metamucil         Miralax AVOID:        Senakot   Accutane    Cough:  Retin-A       Cough Drops  Tetracycline      Phenergan w/ Codeine if Rx  Minocycline      Robitussin (Plain & DM)  Antibiotics:  Crabs/Lice:  Ceclor       RID  Cephalosporins    AVOID:  E-Mycins      Kwell  Keflex  Macrobid/Macrodantin   Diarrhea:  Penicillin      Kao-Pectate  Zithromax      Imodium AD         PUSH FLUIDS AVOID:       Cipro     Fever:  Tetracycline      Tylenol (Regular or Extra  Minocycline       Strength)  Levaquin      Extra Strength-Do not          Exceed 8 tabs/24 hrs Caffeine:        '200mg'$ /day (equiv. To 1 cup of coffee or  approx. 3 12 oz sodas)         Gas: Cold/Hayfever:       Gas-X  Benadryl      Mylicon  Claritin       Phazyme  **Claritin-D        Chlor-Trimeton    Headaches:  Dimetapp      ASA-Free  Excedrin  Drixoral-Non-Drowsy     Cold Compress  Mucinex (Guaifenasin)     Tylenol (Regular or Extra  Sudafed/Sudafed-12 Hour     Strength)  **Sudafed PE Pseudoephedrine   Tylenol Cold & Sinus     Vicks Vapor Rub  Zyrtec  **AVOID if Problems With Blood Pressure         Heartburn: Avoid lying down for at least 1 hour after meals  Aciphex      Maalox     Rash:  Milk of Magnesia     Benadryl    Mylanta       1% Hydrocortisone Cream  Pepcid  Pepcid Complete   Sleep Aids:  Prevacid      Ambien   Prilosec       Benadryl  Rolaids       Chamomile Tea  Tums (Limit 4/day)     Unisom  Zantac       Tylenol PM         Warm milk-add vanilla or  Hemorrhoids:       Sugar for taste  Anusol/Anusol H.C.  (RX: Analapram 2.5%)  Sugar Substitutes:  Hydrocortisone OTC     Ok in moderation  Preparation H      Tucks        Vaseline lotion applied to tissue with wiping    Herpes:     Throat:  Acyclovir      Oragel  Famvir  Valtrex     Vaccines:         Flu Shot Leg Cramps:       *Gardasil  Benadryl      Hepatitis A         Hepatitis B Nasal Spray:       Pneumovax  Saline Nasal Spray     Polio Booster         Tetanus Nausea:       Tuberculosis test or PPD  Vitamin B6 25 mg TID   AVOID:    Dramamine      *Gardasil  Emetrol       Live Poliovirus  Ginger Root 250 mg QID    MMR (measles, mumps &  High Complex Carbs @ Bedtime    rebella)  Sea Bands-Accupressure    Varicella (Chickenpox)  Unisom 1/2 tab TID     *No known complications  If received before Pain:         Known pregnancy;   Darvocet       Resume series after  Lortab        Delivery  Percocet    Yeast:   Tramadol      Femstat  Tylenol 3      Gyne-lotrimin  Ultram       Monistat  Vicodin           MISC:         All Sunscreens           Hair Coloring/highlights          Insect Repellant's          (Including DEET)         Mystic Tans Eating Plan for Pregnant Women While you are pregnant, your body will  require additional nutrition to help support your growing baby. It is recommended that you consume:  150 additional calories each day during your first trimester.  300 additional calories each day during your second trimester.  300 additional calories each day during your third trimester.  Eating a healthy, well-balanced diet is very important for your health and for your baby's health. You also have a higher need for some vitamins and minerals, such as folic acid, calcium, iron, and vitamin D. What do I need to know about eating during pregnancy?  Do not try to lose weight or go on a diet during pregnancy.  Choose healthy, nutritious foods. Choose  of a sandwich with a glass of milk instead of a candy bar or a high-calorie sugar-sweetened beverage.  Limit your overall intake of foods that have "empty calories." These are foods that have little nutritional value, such as sweets, desserts, candies, sugar-sweetened beverages, and fried foods.  Eat a variety of foods, especially fruits and vegetables.  Take a prenatal vitamin to help meet the additional needs during pregnancy, specifically for folic acid, iron, calcium, and vitamin D.  Remember to stay active. Ask your health care provider for exercise recommendations that are specific to you.  Practice good food safety and cleanliness, such as washing your hands before you eat and after you prepare raw meat. This helps to prevent foodborne illnesses, such as listeriosis, that can be very dangerous for your baby. Ask your health care provider for more information about listeriosis. What does 150 extra calories look like? Healthy options for an additional 150 calories each day could be any of the following:  Plain low-fat yogurt (6-8 oz) with  cup of berries.  1 apple with 2 teaspoons of peanut butter.  Cut-up vegetables with  cup of hummus.  Low-fat chocolate milk (8 oz or 1 cup).  1 string cheese with 1 medium orange.   of a  peanut butter and jelly sandwich on whole-wheat bread (1 tsp of peanut butter).  For 300 calories, you could eat two of those healthy options each day. What is a healthy amount of weight to gain? The recommended amount of weight for you to gain is based on your pre-pregnancy BMI. If your pre-pregnancy BMI was:  Less than 18 (underweight), you should gain 28-40 lb.  18-24.9 (normal), you should gain 25-35 lb.  25-29.9 (overweight), you should gain 15-25 lb.  Greater than 30 (obese), you should gain 11-20 lb.  What if I am having twins or multiples? Generally, pregnant women who will be having twins or multiples may need to increase their daily calories by 300-600 calories  each day. The recommended range for total weight gain is 25-54 lb, depending on your pre-pregnancy BMI. Talk with your health care provider for specific guidance about additional nutritional needs, weight gain, and exercise during your pregnancy. What foods can I eat? Grains Any grains. Try to choose whole grains, such as whole-wheat bread, oatmeal, or brown rice. Vegetables Any vegetables. Try to eat a variety of colors and types of vegetables to get a full range of vitamins and minerals. Remember to wash your vegetables well before eating. Fruits Any fruits. Try to eat a variety of colors and types of fruit to get a full range of vitamins and minerals. Remember to wash your fruits well before eating. Meats and Other Protein Sources Lean meats, including chicken, [place], fish, and lean cuts of beef, veal, or pork. Make sure that all meats are cooked to "well done." Tofu. Tempeh. Beans. Eggs. Peanut butter and other nut butters. Seafood, such as shrimp, crab, and lobster. If you choose fish, select types that are higher in omega-3 fatty acids, including salmon, herring, mussels, trout, sardines, and pollock. Make sure that all meats are cooked to food-safe temperatures. Dairy Pasteurized milk and milk alternatives.  Pasteurized yogurt and pasteurized cheese. Cottage cheese. Sour cream. Beverages Water. Juices that contain 100% fruit juice or vegetable juice. Caffeine-free teas and decaffeinated coffee. Drinks that contain caffeine are okay to drink, but it is better to avoid caffeine. Keep your total caffeine intake to less than 200 mg each day (12 oz of coffee, tea, or soda) or as directed by your health care provider. Condiments Any pasteurized condiments. Sweets and Desserts Any sweets and desserts. Fats and Oils Any fats and oils. The items listed above may not be a complete list of recommended foods or beverages. Contact your dietitian for more options. What foods are not recommended? Vegetables Unpasteurized (raw) vegetable juices. Fruits Unpasteurized (raw) fruit juices. Meats and Other Protein Sources Cured meats that have nitrates, such as bacon, salami, and hotdogs. Luncheon meats, bologna, or other deli meats (unless they are reheated until they are steaming hot). Refrigerated pate, meat spreads from a meat counter, smoked seafood that is found in the refrigerated section of a store. Raw fish, such as sushi or sashimi. High mercury content fish, such as tilefish, shark, swordfish, and king mackerel. Raw meats, such as tuna or beef tartare. Undercooked meats and poultry. Make sure that all meats are cooked to food-safe temperatures. Dairy Unpasteurized (raw) milk and any foods that have raw milk in them. Soft cheeses, such as feta, queso blanco, queso fresco, Brie, Camembert cheeses, blue-veined cheeses, and Panela cheese (unless it is made with pasteurized milk, which must be stated on the label). Beverages Alcohol. Sugar-sweetened beverages, such as sodas, teas, or energy drinks. Condiments Homemade fermented foods and drinks, such as pickles, sauerkraut, or kombucha drinks. (Store-bought pasteurized versions of these are okay.) Other Salads that are made in the store, such as ham salad,  chicken salad, egg salad, tuna salad, and seafood salad. The items listed above may not be a complete list of foods and beverages to avoid. Contact your dietitian for more information. This information is not intended to replace advice given to you by your health care provider. Make sure you discuss any questions you have with your health care provider. Document Released: 02/08/2014 Document Revised: 10/02/2015 Document Reviewed: 10/09/2013 Elsevier Interactive Patient Education  [employer].

## 2018-01-18 NOTE — Progress Notes (Signed)
Subjective:    Milanni Kisel is a 37 y.o. female who presents for evaluation of amenorrhea. She believes she could be pregnant. Pregnancy is desired. Sexual Activity: single partner, contraception: none. Current symptoms also include: fatigue and nausea. Last period was normal. She is unsure of her dates.    No LMP recorded. The following portions of the patient's history were reviewed and updated as appropriate: allergies, current medications, past family history, past medical history, past social history, past surgical history and problem list.  Review of Systems Pertinent items are noted in HPI.     Objective:    There were no vitals taken for this visit. General: alert, cooperative, appears stated age, fatigued and no acute distress    Lab Review Urine HCG: positive    ULTRASOUND REPORT  Location: [facility] Date of Service:  01/18/2018  Indications: Dating/Viability Findings:  Mason Jim intrauterine pregnancy is visualized with a CRL consistent with [redacted] weeks gestation, giving an (U/S) EDD of 09/06/18. A clinical EDD has not been established due to unknown LMP.  FHR: 143 BPM CRL measurement: 9.5 mm Yolk sac and early anatomy is normal.  Right Ovary measures 1.9 x 1.6 x 1.3 cm. It is normal in appearance. Left Ovary measures 3.5 x 2.8 x 2.7 cm. It is normal appearance. There is evidence of a corpus luteal cyst in the left ovary. Survey of the adnexa demonstrates no adnexal masses. There is no free peritoneal fluid in the cul de sac.  Impression: 1. 7 week Viable Singleton Intrauterine pregnancy by U/S. 2. A clinical EDD has not been established.  Recommendations: 1.Clinical correlation with the patient's History and Physical Exam. 2. Today's ultrasound should be used to establish EDD of 09/06/18.  Kari Baars, RDMS   Assessment:    Absence of menstruation.     Plan:    Pregnancy Test: Positive: EDC: 09/06/18. Briefly discussed pre-natal care options.. Encouraged  well-balanced diet, plenty of rest when needed, pre-natal vitamins daily and walking for exercise. Discussed self-help for nausea, avoiding OTC medications until consulting provider or pharmacist, other than Tylenol as needed, minimal caffeine (1-2 cups daily) and avoiding alcohol. She will schedule her initial nurse visit @ 10 wks then NOB physical exam @ 12 wks.Samples of PNV & Bonjesta given.   Doreene Burke, CNM

## 2018-01-19 ENCOUNTER — Telehealth: Payer: Self-pay

## 2018-01-19 NOTE — Telephone Encounter (Signed)
[facility] [place] States she took bonjesta last night and it helped. Would like script sent in next visit.

## 2018-02-03 ENCOUNTER — Other Ambulatory Visit: Payer: Self-pay | Admitting: Certified Nurse Midwife

## 2018-02-03 MED ORDER — MECLIZINE HCL 12.5 MG PO TABS: 12.5000 mg | ORAL_TABLET | Freq: Three times a day (TID) | ORAL | 0 refills | Status: DC | PRN

## 2018-02-03 NOTE — Progress Notes (Signed)
Pt tried bonjesta samples without good relief would like to try something else. Discussed use of zofran, phenergan , and Meclizine. Would like to try Meclizine.  Ordered placed.   Doreene Burke, CNM

## 2018-02-10 NOTE — Progress Notes (Signed)
Deanna Hutchinson presents for NOB nurse interview visit. Pregnancy confirmation done 01/18/18 with D&V scan. G3 P2, EDD 09/06/18. Pregnancy education material explained and given. 0 cats in home. NOB labs ordered. HIV labs and drug screen were explained and ordered. PNV encouraged. Genetic screening options discussed.  Patient may discuss with the provider. Patient to follow up with provider on 02/22/18 for NOB physical. Financial policy reviewed and FMLA form explained and signed. All questions answered. BP 102/72   Pulse 75   Ht 5\' 3"  (1.6 m)   Wt 141 lb (64 kg)   BMI 24.98 kg/m

## 2018-02-10 NOTE — Patient Instructions (Addendum)
WHAT OB PATIENTS CAN EXPECT   Confirmation of pregnancy and ultrasound ordered if medically indicated-[redacted] weeks gestation  New OB (NOB) intake with nurse and New OB (NOB) labs- [redacted] weeks gestation  New OB (NOB) physical examination with provider- 11/[redacted] weeks gestation  Flu vaccine-[redacted] weeks gestation  Anatomy scan-[redacted] weeks gestation  Glucose tolerance test, blood work to test for anemia, T-dap vaccine-[redacted] weeks gestation  Vaginal swabs/cultures-STD/Group B strep-[redacted] weeks gestation  Appointments every 4 weeks until 28 weeks  Every 2 weeks from 28 weeks until 36 weeks  Weekly visits from 36 weeks until delivery  Tdap Vaccine (Tetanus, Diphtheria and Pertussis): What You Need to Know 1. Why get vaccinated? Tetanus, diphtheria and pertussis are very serious diseases. Tdap vaccine can protect [place] from these diseases. And, Tdap vaccine given to pregnant women can protect newborn babies against pertussis. TETANUS (Lockjaw) is rare in the [place] today. It causes painful muscle tightening and stiffness, usually all over the body.  It can lead to tightening of muscles in the head and neck so you can't open your mouth, swallow, or sometimes even breathe. Tetanus kills about 1 out of 10 people who are infected even after receiving the best medical care.  DIPHTHERIA is also rare in the [place] today. It can cause a thick coating to form in the back of the throat.  It can lead to breathing problems, heart failure, paralysis, and death.  PERTUSSIS (Whooping Cough) causes severe coughing spells, which can cause difficulty breathing, vomiting and disturbed sleep.  It can also lead to weight loss, incontinence, and rib fractures. Up to 2 in 100 adolescents and 5 in 100 adults with pertussis are hospitalized or have complications, which could include pneumonia or death.  These diseases are caused by bacteria. Diphtheria and pertussis are spread from person to person through secretions from  coughing or sneezing. Tetanus enters the body through cuts, scratches, or wounds. Before vaccines, as many as 200,000 cases of diphtheria, 200,000 cases of pertussis, and hundreds of cases of tetanus, were reported in the [place] each year. Since vaccination began, reports of cases for tetanus and diphtheria have dropped by about 99% and for pertussis by about 80%. 2. Tdap vaccine Tdap vaccine can protect adolescents and adults from tetanus, diphtheria, and pertussis. One dose of Tdap is routinely given at age 39 or 43. People who did not get Tdap at that age should get it as soon as possible. Tdap is especially important for healthcare professionals and anyone having close contact with a baby younger than 12 months. Pregnant women should get a dose of Tdap during every pregnancy, to protect the newborn from pertussis. Infants are most at risk for severe, life-threatening complications from pertussis. Another vaccine, called Td, protects against tetanus and diphtheria, but not pertussis. A Td booster should be given every 10 years. Tdap may be given as one of these boosters if you have never gotten Tdap before. Tdap may also be given after a severe cut or burn to prevent tetanus infection. Your doctor or the person giving you the vaccine can give you more information. Tdap may safely be given at the same time as other vaccines. 3. Some people should not get this vaccine  A person who has ever had a life-threatening allergic reaction after a previous dose of any diphtheria, tetanus or pertussis containing vaccine, OR has a severe allergy to any part of this vaccine, should not get Tdap vaccine. Tell the person giving the vaccine about  any severe allergies.  Anyone who had coma or long repeated seizures within 7 days after a childhood dose of DTP or DTaP, or a previous dose of Tdap, should not get Tdap, unless a cause other than the vaccine was found. They can still get Td.  Talk to your doctor if  you: ? have seizures or another nervous system problem, ? had severe pain or swelling after any vaccine containing diphtheria, tetanus or pertussis, ? ever had a condition called Guillain-Barr Syndrome (GBS), ? aren't feeling well on the day the shot is scheduled. 4. Risks With any medicine, including vaccines, there is a chance of side effects. These are usually mild and go away on their own. Serious reactions are also possible but are rare. Most people who get Tdap vaccine do not have any problems with it. Mild problems following Tdap: (Did not interfere with activities)  Pain where the shot was given (about 3 in 4 adolescents or 2 in 3 adults)  Redness or swelling where the shot was given (about 1 person in 5)  Mild fever of at least 100.73F (up to about 1 in 25 adolescents or 1 in 100 adults)  Headache (about 3 or 4 people in 10)  Tiredness (about 1 person in 3 or 4)  Nausea, vomiting, diarrhea, stomach ache (up to 1 in 4 adolescents or 1 in 10 adults)  Chills, sore joints (about 1 person in 10)  Body aches (about 1 person in 3 or 4)  Rash, swollen glands (uncommon)  Moderate problems following Tdap: (Interfered with activities, but did not require medical attention)  Pain where the shot was given (up to 1 in 5 or 6)  Redness or swelling where the shot was given (up to about 1 in 16 adolescents or 1 in 12 adults)  Fever over 102F (about 1 in 100 adolescents or 1 in 250 adults)  Headache (about 1 in 7 adolescents or 1 in 10 adults)  Nausea, vomiting, diarrhea, stomach ache (up to 1 or 3 people in 100)  Swelling of the entire arm where the shot was given (up to about 1 in 500).  Severe problems following Tdap: (Unable to perform usual activities; required medical attention)  Swelling, severe pain, bleeding and redness in the arm where the shot was given (rare).  Problems that could happen after any vaccine:  People sometimes faint after a medical procedure,  including vaccination. Sitting or lying down for about 15 minutes can help prevent fainting, and injuries caused by a fall. Tell your doctor if you feel dizzy, or have vision changes or ringing in the ears.  Some people get severe pain in the shoulder and have difficulty moving the arm where a shot was given. This happens very rarely.  Any medication can cause a severe allergic reaction. Such reactions from a vaccine are very rare, estimated at fewer than 1 in a million doses, and would happen within a few minutes to a few hours after the vaccination. As with any medicine, there is a very remote chance of a vaccine causing a serious injury or death. The safety of vaccines is always being monitored. For more information, visit: http://www.aguilar.org/ 5. What if there is a serious problem? What should I look for? Look for anything that concerns you, such as signs of a severe allergic reaction, very high fever, or unusual behavior. Signs of a severe allergic reaction can include hives, swelling of the face and throat, difficulty breathing, a fast heartbeat, dizziness, and weakness.  These would usually start a few minutes to a few hours after the vaccination. What should I do?  If you think it is a severe allergic reaction or other emergency that can't wait, call 9-1-1 or get the person to the nearest hospital. Otherwise, call your doctor.  Afterward, the reaction should be reported to the Vaccine Adverse Event Reporting System (VAERS). Your doctor might file this report, or you can do it yourself through the VAERS web site at www.vaers.SamedayNews.es, or by calling 769-529-5915. ? VAERS does not give medical advice. 6. The National Vaccine Injury Compensation Program The [employer] Vaccine Injury Compensation Program (VICP) is a federal program that was created to compensate people who may have been injured by certain vaccines. Persons who believe they may have been injured by a vaccine can learn about  the program and about filing a claim by calling 712-165-9299 or visiting the [place] website at GoldCloset.com.ee. There is a time limit to file a claim for compensation. 7. How can I learn more?  Ask your doctor. He or she can give you the vaccine package insert or suggest other sources of information.  Call your local or state health department.  Contact the Centers for Disease Control and Prevention (CDC): ? Call (928)653-8315 (1-800-CDC-INFO) or ? Visit CDC's website at http://hunter.com/ CDC Tdap Vaccine VIS (07/03/13) This information is not intended to replace advice given to you by your health care provider. Make sure you discuss any questions you have with your health care provider. Document Released: 10/26/2011 Document Revised: 01/15/2016 Document Reviewed: 01/15/2016 Elsevier Interactive Patient Education  2017 [place]. Influenza (Flu) Vaccine (Inactivated or Recombinant): What You Need to Know 1. Why get vaccinated? Influenza ("flu") is a contagious disease that spreads around the [place] every year, usually between October and May. Flu is caused by influenza viruses, and is spread mainly by coughing, sneezing, and close contact. Anyone can get flu. Flu strikes suddenly and can last several days. Symptoms vary by age, but can include:  fever/chills  sore throat  muscle aches  fatigue  cough  headache  runny or stuffy nose  Flu can also lead to pneumonia and blood infections, and cause diarrhea and seizures in children. If you have a medical condition, such as heart or lung disease, flu can make it worse. Flu is more dangerous for some people. Infants and young children, people 51 years of age and older, pregnant women, and people with certain health conditions or a weakened immune system are at greatest risk. Each year thousands of people in the [place] die from flu, and many more are hospitalized. Flu vaccine can:  keep you from  getting flu,  make flu less severe if you do get it, and  keep you from spreading flu to your family and other people. 2. Inactivated and recombinant flu vaccines A dose of flu vaccine is recommended every flu season. Children 6 months through 74 years of age may need two doses during the same flu season. Everyone else needs only one dose each flu season. Some inactivated flu vaccines contain a very small amount of a mercury-based preservative called thimerosal. Studies have not shown thimerosal in vaccines to be harmful, but flu vaccines that do not contain thimerosal are available. There is no live flu virus in flu shots. They cannot cause the flu. There are many flu viruses, and they are always changing. Each year a new flu vaccine is made to protect against three or four viruses that are likely  to cause disease in the upcoming flu season. But even when the vaccine doesn't exactly match these viruses, it may still provide some protection. Flu vaccine cannot prevent:  flu that is caused by a virus not covered by the vaccine, or  illnesses that look like flu but are not.  It takes about 2 weeks for protection to develop after vaccination, and protection lasts through the flu season. 3. Some people should not get this vaccine Tell the person who is giving you the vaccine:  If you have any severe, life-threatening allergies. If you ever had a life-threatening allergic reaction after a dose of flu vaccine, or have a severe allergy to any part of this vaccine, you may be advised not to get vaccinated. Most, but not all, types of flu vaccine contain a small amount of egg protein.  If you ever had Guillain-Barr Syndrome (also called GBS). Some people with a history of GBS should not get this vaccine. This should be discussed with your doctor.  If you are not feeling well. It is usually okay to get flu vaccine when you have a mild illness, but you might be asked to come back when you feel  better.  4. Risks of a vaccine reaction With any medicine, including vaccines, there is a chance of reactions. These are usually mild and go away on their own, but serious reactions are also possible. Most people who get a flu shot do not have any problems with it. Minor problems following a flu shot include:  soreness, redness, or swelling where the shot was given  hoarseness  sore, red or itchy eyes  cough  fever  aches  headache  itching  fatigue  If these problems occur, they usually begin soon after the shot and last 1 or 2 days. More serious problems following a flu shot can include the following:  There may be a small increased risk of Guillain-Barre Syndrome (GBS) after inactivated flu vaccine. This risk has been estimated at 1 or 2 additional cases per million people vaccinated. This is much lower than the risk of severe complications from flu, which can be prevented by flu vaccine.  Young children who get the flu shot along with pneumococcal vaccine (PCV13) and/or DTaP vaccine at the same time might be slightly more likely to have a seizure caused by fever. Ask your doctor for more information. Tell your doctor if a child who is getting flu vaccine has ever had a seizure.  Problems that could happen after any injected vaccine:  People sometimes faint after a medical procedure, including vaccination. Sitting or lying down for about 15 minutes can help prevent fainting, and injuries caused by a fall. Tell your doctor if you feel dizzy, or have vision changes or ringing in the ears.  Some people get severe pain in the shoulder and have difficulty moving the arm where a shot was given. This happens very rarely.  Any medication can cause a severe allergic reaction. Such reactions from a vaccine are very rare, estimated at about 1 in a million doses, and would happen within a few minutes to a few hours after the vaccination. As with any medicine, there is a very remote  chance of a vaccine causing a serious injury or death. The safety of vaccines is always being monitored. For more information, visit: http://www.aguilar.org/ 5. What if there is a serious reaction? What should I look for? Look for anything that concerns you, such as signs of a severe allergic reaction,  very high fever, or unusual behavior. Signs of a severe allergic reaction can include hives, swelling of the face and throat, difficulty breathing, a fast heartbeat, dizziness, and weakness. These would start a few minutes to a few hours after the vaccination. What should I do?  If you think it is a severe allergic reaction or other emergency that can't wait, call 9-1-1 and get the person to the nearest hospital. Otherwise, call your doctor.  Reactions should be reported to the Vaccine Adverse Event Reporting System (VAERS). Your doctor should file this report, or you can do it yourself through the VAERS web site at www.vaers.hhs.gov, or by calling 1-800-822-7967. ? VAERS does not give medical advice. 6. The National Vaccine Injury Compensation Program The National Vaccine Injury Compensation Program (VICP) is a federal program that was created to compensate people who may have been injured by certain vaccines. Persons who believe they may have been injured by a vaccine can learn about the program and about filing a claim by calling 1-800-338-2382 or visiting the VICP website at www.hrsa.gov/vaccinecompensation. There is a time limit to file a claim for compensation. 7. How can I learn more?  Ask your healthcare provider. He or she can give you the vaccine package insert or suggest other sources of information.  Call your local or state health department.  Contact the Centers for Disease Control and Prevention (CDC): ? Call 1-800-232-4636 (1-800-CDC-INFO) or ? Visit CDC's website at www.cdc.gov/flu Vaccine Information Statement, Inactivated Influenza Vaccine (12/14/2013) This information is  not intended to replace advice given to you by your health care provider. Make sure you discuss any questions you have with your health care provider. Document Released: 02/18/2006 Document Revised: 01/15/2016 Document Reviewed: 01/15/2016 Elsevier Interactive Patient Education  2017 [employer]. Second Trimester of Pregnancy The second trimester is from week 13 through week 28, month 4 through 6. This is often the time in pregnancy that you feel your best. Often times, morning sickness has lessened or quit. You may have more energy, and you may get hungry more often. Your unborn baby (fetus) is growing rapidly. At the end of the sixth month, he or she is about 9 inches long and weighs about 1 pounds. You will likely feel the baby move (quickening) between 18 and 20 weeks of pregnancy. Follow these instructions at home:  Avoid all smoking, herbs, and alcohol. Avoid drugs not approved by your doctor.  Do not use any tobacco products, including cigarettes, chewing tobacco, and electronic cigarettes. If you need help quitting, ask your doctor. You may get counseling or other support to help you quit.  Only take medicine as told by your doctor. Some medicines are safe and some are not during pregnancy.  Exercise only as told by your doctor. Stop exercising if you start having cramps.  Eat regular, healthy meals.  Wear a good support bra if your breasts are tender.  Do not use hot tubs, steam rooms, or saunas.  Wear your seat belt when driving.  Avoid raw meat, uncooked cheese, and liter boxes and soil used by cats.  Take your prenatal vitamins.  Take 1500-2000 milligrams of calcium daily starting at the 20th week of pregnancy until you deliver your baby.  Try taking medicine that helps you poop (stool softener) as needed, and if your doctor approves. Eat more fiber by eating fresh fruit, vegetables, and whole grains. Drink enough fluids to keep your pee (urine) clear or pale  yellow.  Take warm water baths (sitz baths)   to soothe pain or discomfort caused by hemorrhoids. Use hemorrhoid cream if your doctor approves.  If you have puffy, bulging veins (varicose veins), wear support hose. Raise (elevate) your feet for 15 minutes, 3-4 times a day. Limit salt in your diet.  Avoid heavy lifting, wear low heals, and sit up straight.  Rest with your legs raised if you have leg cramps or low back pain.  Visit your dentist if you have not gone during your pregnancy. Use a soft toothbrush to brush your teeth. Be gentle when you floss.  You can have sex (intercourse) unless your doctor tells you not to.  Go to your doctor visits. Get help if:  You feel dizzy.  You have mild cramps or pressure in your lower belly (abdomen).  You have a nagging pain in your belly area.  You continue to feel sick to your stomach (nauseous), throw up (vomit), or have watery poop (diarrhea).  You have bad smelling fluid coming from your vagina.  You have pain with peeing (urination). Get help right away if:  You have a fever.  You are leaking fluid from your vagina.  You have spotting or bleeding from your vagina.  You have severe belly cramping or pain.  You lose or gain weight rapidly.  You have trouble catching your breath and have chest pain.  You notice sudden or extreme puffiness (swelling) of your face, hands, ankles, feet, or legs.  You have not felt the baby move in over an hour.  You have severe headaches that do not go away with medicine.  You have vision changes. This information is not intended to replace advice given to you by your health care provider. Make sure you discuss any questions you have with your health care provider. Document Released: 07/21/2009 Document Revised: 10/02/2015 Document Reviewed: 06/27/2012 Elsevier Interactive Patient Education  2017 Elsevier Inc. Morning Sickness Morning sickness is when you feel sick to your stomach (nauseous)  during pregnancy. You may feel sick to your stomach and throw up (vomit). You may feel sick in the morning, but you can feel this way any time of day. Some women feel very sick to their stomach and cannot stop throwing up (hyperemesis gravidarum). Follow these instructions at home:  Only take medicines as told by your doctor.  Take multivitamins as told by your doctor. Taking multivitamins before getting pregnant can stop or lessen the harshness of morning sickness.  Eat dry toast or unsalted crackers before getting out of bed.  Eat 5 to 6 small meals a day.  Eat dry and bland foods like rice and baked potatoes.  Do not drink liquids with meals. Drink between meals.  Do not eat greasy, fatty, or spicy foods.  Have someone cook for you if the smell of food causes you to feel sick or throw up.  If you feel sick to your stomach after taking prenatal vitamins, take them at night or with a snack.  Eat protein when you need a snack (nuts, yogurt, cheese).  Eat unsweetened gelatins for dessert.  Wear a bracelet used for sea sickness (acupressure wristband).  Go to a doctor that puts thin needles into certain body points (acupuncture) to improve how you feel.  Do not smoke.  Use a humidifier to keep the air in your house free of odors.  Get lots of fresh air. Contact a doctor if:  You need medicine to feel better.  You feel dizzy or lightheaded.  You are losing weight. Get help   right away if:  You feel very sick to your stomach and cannot stop throwing up.  You pass out (faint). This information is not intended to replace advice given to you by your health care provider. Make sure you discuss any questions you have with your health care provider. Document Released: 06/03/2004 Document Revised: 10/02/2015 Document Reviewed: 10/11/2012 Elsevier Interactive Patient Education  2017 [employer]. How a Baby Grows During Pregnancy Pregnancy begins when a female's sperm enters a  female's egg (fertilization). This happens in one of the tubes (fallopian tubes) that connect the ovaries to the womb (uterus). The fertilized egg is called an embryo until it reaches 10 weeks. From 10 weeks until birth, it is called a fetus. The fertilized egg moves down the fallopian tube to the uterus. Then it implants into the lining of the uterus and begins to grow. The developing fetus receives oxygen and nutrients through the pregnant woman's bloodstream and the tissues that grow (placenta) to support the fetus. The placenta is the life support system for the fetus. It provides nutrition and removes waste. Learning as much as you can about your pregnancy and how your baby is developing can help you enjoy the experience. It can also make you aware of when there might be a problem and when to ask questions. How long does a typical pregnancy last? A pregnancy usually lasts 280 days, or about 40 weeks. Pregnancy is divided into three trimesters:  First trimester: 0-13 weeks.  Second trimester: 14-27 weeks.  Third trimester: 28-40 weeks.  The day when your baby is considered ready to be born (full term) is your estimated date of delivery. How does my baby develop month by month? First month  The fertilized egg attaches to the inside of the uterus.  Some cells will form the placenta. Others will form the fetus.  The arms, legs, brain, spinal cord, lungs, and heart begin to develop.  At the end of the first month, the heart begins to beat.  Second month  The bones, inner ear, eyelids, hands, and feet form.  The genitals develop.  By the end of 8 weeks, all major organs are developing.  Third month  All of the internal organs are forming.  Teeth develop below the gums.  Bones and muscles begin to grow. The spine can flex.  The skin is transparent.  Fingernails and toenails begin to form.  Arms and legs continue to grow longer, and hands and feet develop.  The fetus is  about 3 in (7.6 cm) long.  Fourth month  The placenta is completely formed.  The external sex organs, neck, outer ear, eyebrows, eyelids, and fingernails are formed.  The fetus can hear, swallow, and move its arms and legs.  The kidneys begin to produce urine.  The skin is covered with a white waxy coating (vernix) and very fine hair (lanugo).  Fifth month  The fetus moves around more and can be felt for the first time (quickening).  The fetus starts to sleep and wake up and may begin to suck its finger.  The nails grow to the end of the fingers.  The organ in the digestive system that makes bile (gallbladder) functions and helps to digest the nutrients.  If your baby is a girl, eggs are present in her ovaries. If your baby is a boy, testicles start to move down into his scrotum.  Sixth month  The lungs are formed, but the fetus is not yet able to breathe.  The eyes open. The brain continues to develop.  Your baby has fingerprints and toe prints. Your baby's hair grows thicker.  At the end of the second trimester, the fetus is about 9 in (22.9 cm) long.  Seventh month  The fetus kicks and stretches.  The eyes are developed enough to sense changes in light.  The hands can make a grasping motion.  The fetus responds to sound.  Eighth month  All organs and body systems are fully developed and functioning.  Bones harden and taste buds develop. The fetus may hiccup.  Certain areas of the brain are still developing. The skull remains soft.  Ninth month  The fetus gains about  lb (0.23 kg) each week.  The lungs are fully developed.  Patterns of sleep develop.  The fetus's head typically moves into a head-down position (vertex) in the uterus to prepare for birth. If the buttocks move into a vertex position instead, the baby is breech.  The fetus weighs 6-9 lbs (2.72-4.08 kg) and is 19-20 in (48.26-50.8 cm) long.  What can I do to have a healthy pregnancy and  help my baby develop? Eating and Drinking  Eat a healthy diet. ? Talk with your health care provider to make sure that you are getting the nutrients that you and your baby need. ? Visit www.BuildDNA.es to learn about creating a healthy diet.  Gain a healthy amount of weight during pregnancy as advised by your health care provider. This is usually 25-35 pounds. You may need to: ? Gain more if you were underweight before getting pregnant or if you are pregnant with more than one baby. ? Gain less if you were overweight or obese when you got pregnant.  Medicines and Vitamins  Take prenatal vitamins as directed by your health care provider. These include vitamins such as folic acid, iron, calcium, and vitamin D. They are important for healthy development.  Take medicines only as directed by your health care provider. Read labels and ask a pharmacist or your health care provider whether over-the-counter medicines, supplements, and prescription drugs are safe to take during pregnancy.  Activities  Be physically active as advised by your health care provider. Ask your health care provider to recommend activities that are safe for you to do, such as walking or swimming.  Do not participate in strenuous or extreme sports.  Lifestyle  Do not drink alcohol.  Do not use any tobacco products, including cigarettes, chewing tobacco, or electronic cigarettes. If you need help quitting, ask your health care provider.  Do not use illegal drugs.  Safety  Avoid exposure to mercury, lead, or other heavy metals. Ask your health care provider about common sources of these heavy metals.  Avoid listeria infection during pregnancy. Follow these precautions: ? Do not eat soft cheeses or deli meats. ? Do not eat hot dogs unless they have been warmed up to the point of steaming, such as in the microwave oven. ? Do not drink unpasteurized milk.  Avoid toxoplasmosis infection during pregnancy. Follow  these precautions: ? Do not change your cat's litter box, if you have a cat. Ask someone else to do this for you. ? Wear gardening gloves while working in the yard.  General Instructions  Keep all follow-up visits as directed by your health care provider. This is important. This includes prenatal care and screening tests.  Manage any chronic health conditions. Work closely with your health care provider to keep conditions, such as diabetes, under control.  How do I know if my baby is developing well? At each prenatal visit, your health care provider will do several different tests to check on your health and keep track of your baby's development. These include:  Fundal height. ? Your health care provider will measure your growing belly from top to bottom using a tape measure. ? Your health care provider will also feel your belly to determine your baby's position.  Heartbeat. ? An ultrasound in the first trimester can confirm pregnancy and show a heartbeat, depending on how far along you are. ? Your health care provider will check your baby's heart rate at every prenatal visit. ? As you get closer to your delivery date, you may have regular fetal heart rate monitoring to make sure that your baby is not in distress.  Second trimester ultrasound. ? This ultrasound checks your baby's development. It also indicates your baby's gender.  What should I do if I have concerns about my baby's development? Always talk with your health care provider about any concerns that you may have. This information is not intended to replace advice given to you by your health care provider. Make sure you discuss any questions you have with your health care provider. Document Released: 10/13/2007 Document Revised: 10/02/2015 Document Reviewed: 10/03/2013 Elsevier Interactive Patient Education  2018 [place] of Pregnancy The first trimester of pregnancy is from week 1 until the end of week  13 (months 1 through 3). During this time, your baby will begin to develop inside you. At 6-8 weeks, the eyes and face are formed, and the heartbeat can be seen on ultrasound. At the end of 12 weeks, all the baby's organs are formed. Prenatal care is all the medical care you receive before the birth of your baby. Make sure you get good prenatal care and follow all of your doctor's instructions. Follow these instructions at home: Medicines  Take over-the-counter and prescription medicines only as told by your doctor. Some medicines are safe and some medicines are not safe during pregnancy.  Take a prenatal vitamin that contains at least 600 micrograms (mcg) of folic acid.  If you have trouble pooping (constipation), take medicine that will make your stool soft (stool softener) if your doctor approves. Eating and drinking  Eat regular, healthy meals.  Your doctor will tell you the amount of weight gain that is right for you.  Avoid raw meat and uncooked cheese.  If you feel sick to your stomach (nauseous) or throw up (vomit): ? Eat 4 or 5 small meals a day instead of 3 large meals. ? Try eating a few soda crackers. ? Drink liquids between meals instead of during meals.  To prevent constipation: ? Eat foods that are high in fiber, like fresh fruits and vegetables, whole grains, and beans. ? Drink enough fluids to keep your pee (urine) clear or pale yellow. Activity  Exercise only as told by your doctor. Stop exercising if you have cramps or pain in your lower belly (abdomen) or low back.  Do not exercise if it is too hot, too humid, or if you are in a place of great height (high altitude).  Try to avoid standing for long periods of time. Move your legs often if you must stand in one place for a long time.  Avoid heavy lifting.  Wear low-heeled shoes. Sit and stand up straight.  You can have sex unless your doctor tells you not to. Relieving pain and discomfort  Wear a  good  support bra if your breasts are sore.  Take warm water baths (sitz baths) to soothe pain or discomfort caused by hemorrhoids. Use hemorrhoid cream if your doctor says it is okay.  Rest with your legs raised if you have leg cramps or low back pain.  If you have puffy, bulging veins (varicose veins) in your legs: ? Wear support hose or compression stockings as told by your doctor. ? Raise (elevate) your feet for 15 minutes, 3-4 times a day. ? Limit salt in your food. Prenatal care  Schedule your prenatal visits by the twelfth week of pregnancy.  Write down your questions. Take them to your prenatal visits.  Keep all your prenatal visits as told by your doctor. This is important. Safety  Wear your seat belt at all times when driving.  Make a list of emergency phone numbers. The list should include numbers for family, friends, the hospital, and police and fire departments. General instructions  Ask your doctor for a referral to a local prenatal class. Begin classes no later than at the start of month 6 of your pregnancy.  Ask for help if you need counseling or if you need help with nutrition. Your doctor can give you advice or tell you where to go for help.  Do not use hot tubs, steam rooms, or saunas.  Do not douche or use tampons or scented sanitary pads.  Do not cross your legs for long periods of time.  Avoid all herbs and alcohol. Avoid drugs that are not approved by your doctor.  Do not use any tobacco products, including cigarettes, chewing tobacco, and electronic cigarettes. If you need help quitting, ask your doctor. You may get counseling or other support to help you quit.  Avoid cat litter boxes and soil used by cats. These carry germs that can cause birth defects in the baby and can cause a loss of your baby (miscarriage) or stillbirth.  Visit your dentist. At home, brush your teeth with a soft toothbrush. Be gentle when you floss. Contact a doctor if:  You are  dizzy.  You have mild cramps or pressure in your lower belly.  You have a nagging pain in your belly area.  You continue to feel sick to your stomach, you throw up, or you have watery poop (diarrhea).  You have a bad smelling fluid coming from your vagina.  You have pain when you pee (urinate).  You have increased puffiness (swelling) in your face, hands, legs, or ankles. Get help right away if:  You have a fever.  You are leaking fluid from your vagina.  You have spotting or bleeding from your vagina.  You have very bad belly cramping or pain.  You gain or lose weight rapidly.  You throw up blood. It may look like coffee grounds.  You are around people who have [place] measles, fifth disease, or chickenpox.  You have a very bad headache.  You have shortness of breath.  You have any kind of trauma, such as from a fall or a car accident. Summary  The first trimester of pregnancy is from week 1 until the end of week 13 (months 1 through 3).  To take care of yourself and your unborn baby, you will need to eat healthy meals, take medicines only if your doctor tells you to do so, and do activities that are safe for you and your baby.  Keep all follow-up visits as told by your doctor. This is important as  your doctor will have to ensure that your baby is healthy and growing well. This information is not intended to replace advice given to you by your health care provider. Make sure you discuss any questions you have with your health care provider. Document Released: 10/13/2007 Document Revised: 05/04/2016 Document Reviewed: 05/04/2016 Elsevier Interactive Patient Education  2017 [place]. Commonly Asked Questions During Pregnancy  Cats: A parasite can be excreted in cat feces.  To avoid exposure you need to have another person empty the little box.  If you must empty the litter box you will need to wear gloves.  Wash your hands after handling your cat.  This parasite can  also be found in raw or undercooked meat so this should also be avoided.  Colds, Sore Throats, Flu: Please check your medication sheet to see what you can take for symptoms.  If your symptoms are unrelieved by these medications please call the office.  Dental Work: Most any dental work Investment banker, corporate recommends is permitted.  X-rays should only be taken during the first trimester if absolutely necessary.  Your abdomen should be shielded with a lead apron during all x-rays.  Please notify your provider prior to receiving any x-rays.  Novocaine is fine; gas is not recommended.  If your dentist requires a note from [place] prior to dental work please call the office and we will provide one for you.  Exercise: Exercise is an important part of staying healthy during your pregnancy.  You may continue most exercises you were accustomed to prior to pregnancy.  Later in your pregnancy you will most likely notice you have difficulty with activities requiring balance like riding a bicycle.  It is important that you listen to your body and avoid activities that put you at a higher risk of falling.  Adequate rest and staying well hydrated are a must!  If you have questions about the safety of specific activities ask your provider.    Exposure to Children with illness: Try to avoid obvious exposure; report any symptoms to [place] when noted,  If you have chicken pos, red measles or mumps, you should be immune to these diseases.   Please do not take any vaccines while pregnant unless you have checked with your OB provider.  Fetal Movement: After 28 weeks we recommend you do "kick counts" twice daily.  Lie or sit down in a calm quiet environment and count your baby movements "kicks".  You should feel your baby at least 10 times per hour.  If you have not felt 10 kicks within the first hour get up, walk around and have something sweet to eat or drink then repeat for an additional hour.  If count remains less than 10 per hour notify  your provider.  Fumigating: Follow your pest control agent's advice as to how long to stay out of your home.  Ventilate the area well before re-entering.  Hemorrhoids:   Most over-the-counter preparations can be used during pregnancy.  Check your medication to see what is safe to use.  It is important to use a stool softener or fiber in your diet and to drink lots of liquids.  If hemorrhoids seem to be getting worse please call the office.   Hot Tubs:  Hot tubs Jacuzzis and saunas are not recommended while pregnant.  These increase your internal body temperature and should be avoided.  Intercourse:  Sexual intercourse is safe during pregnancy as long as you are comfortable, unless otherwise advised by your  provider.  Spotting may occur after intercourse; report any bright red bleeding that is heavier than spotting.  Labor:  If you know that you are in labor, please go to the hospital.  If you are unsure, please call the office and let us help you decide what to do.  Lifting, straining, etc:  If your job requires heavy lifting or straining please check with your provider for any limitations.  Generally, you should not lift items heavier than that you can lift simply with your hands and arms (no back muscles)  Painting:  Paint fumes do not harm your pregnancy, but may make you ill and should be avoided if possible.  Latex or water based paints have less odor than oils.  Use adequate ventilation while painting.  Permanents & Hair Color:  Chemicals in hair dyes are not recommended as they cause increase hair dryness which can increase hair loss during pregnancy.  " Highlighting" and permanents are allowed.  Dye may be absorbed differently and permanents may not hold as well during pregnancy.  Sunbathing:  Use a sunscreen, as skin burns easily during pregnancy.  Drink plenty of fluids; avoid over heating.  Tanning Beds:  Because their possible side effects are still unknown, tanning beds are not  recommended.  Ultrasound Scans:  Routine ultrasounds are performed at approximately 20 weeks.  You will be able to see your baby's general anatomy an if you would like to know the gender this can usually be determined as well.  If it is questionable when you conceived you may also receive an ultrasound early in your pregnancy for dating purposes.  Otherwise ultrasound exams are not routinely performed unless there is a medical necessity.  Although you can request a scan we ask that you pay for it when conducted because insurance does not cover " patient request" scans.  Work: If your pregnancy proceeds without complications you may work until your due date, unless your physician or employer advises otherwise.  Round Ligament Pain/Pelvic Discomfort:  Sharp, shooting pains not associated with bleeding are fairly common, usually occurring in the second trimester of pregnancy.  They tend to be worse when standing up or when you remain standing for long periods of time.  These are the result of pressure of certain pelvic ligaments called "round ligaments".  Rest, Tylenol and heat seem to be the most effective relief.  As the womb and fetus grow, they rise out of the pelvis and the discomfort improves.  Please notify the office if your pain seems different than that described.  It may represent a more serious condition.  Common Medications Safe in Pregnancy  Acne:      Constipation:  Benzoyl Peroxide     Colace  Clindamycin      Dulcolax Suppository  Topica Erythromycin     Fibercon  Salicylic Acid      Metamucil         Miralax AVOID:        Senakot   Accutane    Cough:  Retin-A       Cough Drops  Tetracycline      Phenergan w/ Codeine if Rx  Minocycline      Robitussin (Plain & DM)  Antibiotics:     Crabs/Lice:  Ceclor       RID  Cephalosporins    AVOID:  E-Mycins      Kwell  Keflex  Macrobid/Macrodantin   Diarrhea:  Penicillin      Kao-Pectate  Zithromax  Imodium AD         PUSH  FLUIDS AVOID:       Cipro     Fever:  Tetracycline      Tylenol (Regular or Extra  Minocycline       Strength)  Levaquin      Extra Strength-Do not          Exceed 8 tabs/24 hrs Caffeine:        <286m/day (equiv. To 1 cup of coffee or  approx. 3 12 oz sodas)         Gas: Cold/Hayfever:       Gas-X  Benadryl      Mylicon  Claritin       Phazyme  **Claritin-D        Chlor-Trimeton    Headaches:  Dimetapp      ASA-Free Excedrin  Drixoral-Non-Drowsy     Cold Compress  Mucinex (Guaifenasin)     Tylenol (Regular or Extra  Sudafed/Sudafed-12 Hour     Strength)  **Sudafed PE Pseudoephedrine   Tylenol Cold & Sinus     Vicks Vapor Rub  Zyrtec  **AVOID if Problems With Blood Pressure         Heartburn: Avoid lying down for at least 1 hour after meals  Aciphex      Maalox     Rash:  Milk of Magnesia     Benadryl    Mylanta       1% Hydrocortisone Cream  Pepcid  Pepcid Complete   Sleep Aids:  Prevacid      Ambien   Prilosec       Benadryl  Rolaids       Chamomile Tea  Tums (Limit 4/day)     Unisom  Zantac       Tylenol PM         Warm milk-add vanilla or  Hemorrhoids:       Sugar for taste  Anusol/Anusol H.C.  (RX: Analapram 2.5%)  Sugar Substitutes:  Hydrocortisone OTC     Ok in moderation  Preparation H      Tucks        Vaseline lotion applied to tissue with wiping    Herpes:     Throat:  Acyclovir      Oragel  Famvir  Valtrex     Vaccines:         Flu Shot Leg Cramps:       *Gardasil  Benadryl      Hepatitis A         Hepatitis B Nasal Spray:       Pneumovax  Saline Nasal Spray     Polio Booster         Tetanus Nausea:       Tuberculosis test or PPD  Vitamin B6 25 mg TID   AVOID:    Dramamine      *Gardasil  Emetrol       Live Poliovirus  Ginger Root 250 mg QID    MMR (measles, mumps &  High Complex Carbs @ Bedtime    rebella)  Sea Bands-Accupressure    Varicella (Chickenpox)  Unisom 1/2 tab TID     *No known complications           If received  before Pain:         Known pregnancy;   Darvocet       Resume series after  Lortab        Delivery  Percocet    Yeast:   Tramadol      Femstat  Tylenol 3      Gyne-lotrimin  Ultram       Monistat  Vicodin           MISC:         All Sunscreens           Hair Coloring/highlights          Insect Repellant's          (Including DEET)         [facility]  [address], [place], [place] [address]  Phone: [phone]   [facility] (second location)  [address] [place], [place] [address]  Phone: [phone]   [facility]) [place], [place], [place] [address] Phone: [phone]   [address] [place], [place] [address]  Phone: [phone] WHAT OB PATIENTS CAN EXPECT   Confirmation of pregnancy and ultrasound ordered if medically indicated-[redacted] weeks gestation  New OB (NOB) intake with nurse and New OB (NOB) labs- [redacted] weeks gestation  New OB (NOB) physical examination with provider- 11/[redacted] weeks gestation  Flu vaccine-[redacted] weeks gestation  Anatomy scan-[redacted] weeks gestation  Glucose tolerance test, blood work to test for anemia, T-dap vaccine-[redacted] weeks gestation  Vaginal swabs/cultures-STD/Group B strep-[redacted] weeks gestation  Appointments every 4 weeks until 28 weeks  Every 2 weeks from 28 weeks until 36 weeks  Weekly visits from 36 weeks until delivery  Tdap Vaccine (Tetanus, Diphtheria and Pertussis): What You Need to Know 1. Why get vaccinated? Tetanus, diphtheria and pertussis are very serious diseases. Tdap vaccine can protect [place] from these diseases. And, Tdap vaccine given to pregnant women can protect newborn babies against pertussis. TETANUS (Lockjaw) is rare in the [place] today. It causes painful muscle tightening and stiffness, usually all over the body.  It can lead to tightening of muscles in the head and neck so you can't open  your mouth, swallow, or sometimes even breathe. Tetanus kills about 1 out of 10 people who are infected even after receiving the best medical care.  DIPHTHERIA is also rare in the [place] today. It can cause a thick coating to form in the back of the throat.  It can lead to breathing problems, heart failure, paralysis, and death.  PERTUSSIS (Whooping Cough) causes severe coughing spells, which can cause difficulty breathing, vomiting and disturbed sleep.  It can also lead to weight loss, incontinence, and rib fractures. Up to 2 in 100 adolescents and 5 in 100 adults with pertussis are hospitalized or have complications, which could include pneumonia or death.  These diseases are caused by bacteria. Diphtheria and pertussis are spread from person to person through secretions from coughing or sneezing. Tetanus enters the body through cuts, scratches, or wounds. Before vaccines, as many as 200,000 cases of diphtheria, 200,000 cases of pertussis, and hundreds of cases of tetanus, were reported in the [place] each year. Since vaccination began, reports of cases for tetanus and diphtheria have dropped by about 99% and for pertussis by about 80%. 2. Tdap vaccine Tdap vaccine can protect adolescents and adults from tetanus, diphtheria, and pertussis. One dose of Tdap is routinely given at age 21 or 80. People who did not get Tdap at that age should get it as soon as possible. Tdap is especially important for healthcare professionals and anyone having close  contact with a baby younger than 12 months. Pregnant women should get a dose of Tdap during every pregnancy, to protect the newborn from pertussis. Infants are most at risk for severe, life-threatening complications from pertussis. Another vaccine, called Td, protects against tetanus and diphtheria, but not pertussis. A Td booster should be given every 10 years. Tdap may be given as one of these boosters if you have never gotten Tdap before.  Tdap may also be given after a severe cut or burn to prevent tetanus infection. Your doctor or the person giving you the vaccine can give you more information. Tdap may safely be given at the same time as other vaccines. 3. Some people should not get this vaccine  A person who has ever had a life-threatening allergic reaction after a previous dose of any diphtheria, tetanus or pertussis containing vaccine, OR has a severe allergy to any part of this vaccine, should not get Tdap vaccine. Tell the person giving the vaccine about any severe allergies.  Anyone who had coma or long repeated seizures within 7 days after a childhood dose of DTP or DTaP, or a previous dose of Tdap, should not get Tdap, unless a cause other than the vaccine was found. They can still get Td.  Talk to your doctor if you: ? have seizures or another nervous system problem, ? had severe pain or swelling after any vaccine containing diphtheria, tetanus or pertussis, ? ever had a condition called Guillain-Barr Syndrome (GBS), ? aren't feeling well on the day the shot is scheduled. 4. Risks With any medicine, including vaccines, there is a chance of side effects. These are usually mild and go away on their own. Serious reactions are also possible but are rare. Most people who get Tdap vaccine do not have any problems with it. Mild problems following Tdap: (Did not interfere with activities)  Pain where the shot was given (about 3 in 4 adolescents or 2 in 3 adults)  Redness or swelling where the shot was given (about 1 person in 5)  Mild fever of at least 100.63F (up to about 1 in 25 adolescents or 1 in 100 adults)  Headache (about 3 or 4 people in 10)  Tiredness (about 1 person in 3 or 4)  Nausea, vomiting, diarrhea, stomach ache (up to 1 in 4 adolescents or 1 in 10 adults)  Chills, sore joints (about 1 person in 10)  Body aches (about 1 person in 3 or 4)  Rash, swollen glands (uncommon)  Moderate problems  following Tdap: (Interfered with activities, but did not require medical attention)  Pain where the shot was given (up to 1 in 5 or 6)  Redness or swelling where the shot was given (up to about 1 in 16 adolescents or 1 in 12 adults)  Fever over 102F (about 1 in 100 adolescents or 1 in 250 adults)  Headache (about 1 in 7 adolescents or 1 in 10 adults)  Nausea, vomiting, diarrhea, stomach ache (up to 1 or 3 people in 100)  Swelling of the entire arm where the shot was given (up to about 1 in 500).  Severe problems following Tdap: (Unable to perform usual activities; required medical attention)  Swelling, severe pain, bleeding and redness in the arm where the shot was given (rare).  Problems that could happen after any vaccine:  People sometimes faint after a medical procedure, including vaccination. Sitting or lying down for about 15 minutes can help prevent fainting, and injuries caused by a fall.  Tell your doctor if you feel dizzy, or have vision changes or ringing in the ears.  Some people get severe pain in the shoulder and have difficulty moving the arm where a shot was given. This happens very rarely.  Any medication can cause a severe allergic reaction. Such reactions from a vaccine are very rare, estimated at fewer than 1 in a million doses, and would happen within a few minutes to a few hours after the vaccination. As with any medicine, there is a very remote chance of a vaccine causing a serious injury or death. The safety of vaccines is always being monitored. For more information, visit: http://www.aguilar.org/ 5. What if there is a serious problem? What should I look for? Look for anything that concerns you, such as signs of a severe allergic reaction, very high fever, or unusual behavior. Signs of a severe allergic reaction can include hives, swelling of the face and throat, difficulty breathing, a fast heartbeat, dizziness, and weakness. These would usually start a  few minutes to a few hours after the vaccination. What should I do?  If you think it is a severe allergic reaction or other emergency that can't wait, call 9-1-1 or get the person to the nearest hospital. Otherwise, call your doctor.  Afterward, the reaction should be reported to the Vaccine Adverse Event Reporting System (VAERS). Your doctor might file this report, or you can do it yourself through the VAERS web site at www.vaers.SamedayNews.es, or by calling 854-857-5101. ? VAERS does not give medical advice. 6. The National Vaccine Injury Compensation Program The [employer] Vaccine Injury Compensation Program (VICP) is a federal program that was created to compensate people who may have been injured by certain vaccines. Persons who believe they may have been injured by a vaccine can learn about the program and about filing a claim by calling (310) 246-8325 or visiting the [place] website at GoldCloset.com.ee. There is a time limit to file a claim for compensation. 7. How can I learn more?  Ask your doctor. He or she can give you the vaccine package insert or suggest other sources of information.  Call your local or state health department.  Contact the Centers for Disease Control and Prevention (CDC): ? Call 801-178-1849 (1-800-CDC-INFO) or ? Visit CDC's website at http://hunter.com/ CDC Tdap Vaccine VIS (07/03/13) This information is not intended to replace advice given to you by your health care provider. Make sure you discuss any questions you have with your health care provider. Document Released: 10/26/2011 Document Revised: 01/15/2016 Document Reviewed: 01/15/2016 Elsevier Interactive Patient Education  2017 [place]. Influenza (Flu) Vaccine (Inactivated or Recombinant): What You Need to Know 1. Why get vaccinated? Influenza ("flu") is a contagious disease that spreads around the [place] every year, usually between October and May. Flu is caused by influenza  viruses, and is spread mainly by coughing, sneezing, and close contact. Anyone can get flu. Flu strikes suddenly and can last several days. Symptoms vary by age, but can include:  fever/chills  sore throat  muscle aches  fatigue  cough  headache  runny or stuffy nose  Flu can also lead to pneumonia and blood infections, and cause diarrhea and seizures in children. If you have a medical condition, such as heart or lung disease, flu can make it worse. Flu is more dangerous for some people. Infants and young children, people 61 years of age and older, pregnant women, and people with certain health conditions or a weakened immune system are at greatest risk.  Each year thousands of people in the [place] die from flu, and many more are hospitalized. Flu vaccine can:  keep you from getting flu,  make flu less severe if you do get it, and  keep you from spreading flu to your family and other people. 2. Inactivated and recombinant flu vaccines A dose of flu vaccine is recommended every flu season. Children 6 months through 4 years of age may need two doses during the same flu season. Everyone else needs only one dose each flu season. Some inactivated flu vaccines contain a very small amount of a mercury-based preservative called thimerosal. Studies have not shown thimerosal in vaccines to be harmful, but flu vaccines that do not contain thimerosal are available. There is no live flu virus in flu shots. They cannot cause the flu. There are many flu viruses, and they are always changing. Each year a new flu vaccine is made to protect against three or four viruses that are likely to cause disease in the upcoming flu season. But even when the vaccine doesn't exactly match these viruses, it may still provide some protection. Flu vaccine cannot prevent:  flu that is caused by a virus not covered by the vaccine, or  illnesses that look like flu but are not.  It takes about 2 weeks for  protection to develop after vaccination, and protection lasts through the flu season. 3. Some people should not get this vaccine Tell the person who is giving you the vaccine:  If you have any severe, life-threatening allergies. If you ever had a life-threatening allergic reaction after a dose of flu vaccine, or have a severe allergy to any part of this vaccine, you may be advised not to get vaccinated. Most, but not all, types of flu vaccine contain a small amount of egg protein.  If you ever had Guillain-Barr Syndrome (also called GBS). Some people with a history of GBS should not get this vaccine. This should be discussed with your doctor.  If you are not feeling well. It is usually okay to get flu vaccine when you have a mild illness, but you might be asked to come back when you feel better.  4. Risks of a vaccine reaction With any medicine, including vaccines, there is a chance of reactions. These are usually mild and go away on their own, but serious reactions are also possible. Most people who get a flu shot do not have any problems with it. Minor problems following a flu shot include:  soreness, redness, or swelling where the shot was given  hoarseness  sore, red or itchy eyes  cough  fever  aches  headache  itching  fatigue  If these problems occur, they usually begin soon after the shot and last 1 or 2 days. More serious problems following a flu shot can include the following:  There may be a small increased risk of Guillain-Barre Syndrome (GBS) after inactivated flu vaccine. This risk has been estimated at 1 or 2 additional cases per million people vaccinated. This is much lower than the risk of severe complications from flu, which can be prevented by flu vaccine.  Young children who get the flu shot along with pneumococcal vaccine (PCV13) and/or DTaP vaccine at the same time might be slightly more likely to have a seizure caused by fever. Ask your doctor for more  information. Tell your doctor if a child who is getting flu vaccine has ever had a seizure.  Problems that could happen after any  injected vaccine:  People sometimes faint after a medical procedure, including vaccination. Sitting or lying down for about 15 minutes can help prevent fainting, and injuries caused by a fall. Tell your doctor if you feel dizzy, or have vision changes or ringing in the ears.  Some people get severe pain in the shoulder and have difficulty moving the arm where a shot was given. This happens very rarely.  Any medication can cause a severe allergic reaction. Such reactions from a vaccine are very rare, estimated at about 1 in a million doses, and would happen within a few minutes to a few hours after the vaccination. As with any medicine, there is a very remote chance of a vaccine causing a serious injury or death. The safety of vaccines is always being monitored. For more information, visit: http://www.aguilar.org/ 5. What if there is a serious reaction? What should I look for? Look for anything that concerns you, such as signs of a severe allergic reaction, very high fever, or unusual behavior. Signs of a severe allergic reaction can include hives, swelling of the face and throat, difficulty breathing, a fast heartbeat, dizziness, and weakness. These would start a few minutes to a few hours after the vaccination. What should I do?  If you think it is a severe allergic reaction or other emergency that can't wait, call 9-1-1 and get the person to the nearest hospital. Otherwise, call your doctor.  Reactions should be reported to the Vaccine Adverse Event Reporting System (VAERS). Your doctor should file this report, or you can do it yourself through the VAERS web site at www.vaers.SamedayNews.es, or by calling 337-342-5719. ? VAERS does not give medical advice. 6. The National Vaccine Injury Compensation Program The [employer] Vaccine Injury Compensation Program (VICP) is a  federal program that was created to compensate people who may have been injured by certain vaccines. Persons who believe they may have been injured by a vaccine can learn about the program and about filing a claim by calling 519-609-8696 or visiting the [place] website at GoldCloset.com.ee. There is a time limit to file a claim for compensation. 7. How can I learn more?  Ask your healthcare provider. He or she can give you the vaccine package insert or suggest other sources of information.  Call your local or state health department.  Contact the Centers for Disease Control and Prevention (CDC): ? Call 8187385435 (1-800-CDC-INFO) or ? Visit CDC's website at https://gibson.com/ Vaccine Information Statement, Inactivated Influenza Vaccine (12/14/2013) This information is not intended to replace advice given to you by your health care provider. Make sure you discuss any questions you have with your health care provider. Document Released: 02/18/2006 Document Revised: 01/15/2016 Document Reviewed: 01/15/2016 Elsevier Interactive Patient Education  2017 [place] of Pregnancy The second trimester is from week 13 through week 28, month 4 through 6. This is often the time in pregnancy that you feel your best. Often times, morning sickness has lessened or quit. You may have more energy, and you may get hungry more often. Your unborn baby (fetus) is growing rapidly. At the end of the sixth month, he or she is about 9 inches long and weighs about 1 pounds. You will likely feel the baby move (quickening) between 18 and 20 weeks of pregnancy. Follow these instructions at home:  Avoid all smoking, herbs, and alcohol. Avoid drugs not approved by your doctor.  Do not use any tobacco products, including cigarettes, chewing tobacco, and electronic cigarettes. If you need  help quitting, ask your doctor. You may get counseling or other support to help you quit.  Only take  medicine as told by your doctor. Some medicines are safe and some are not during pregnancy.  Exercise only as told by your doctor. Stop exercising if you start having cramps.  Eat regular, healthy meals.  Wear a good support bra if your breasts are tender.  Do not use hot tubs, steam rooms, or saunas.  Wear your seat belt when driving.  Avoid raw meat, uncooked cheese, and liter boxes and soil used by cats.  Take your prenatal vitamins.  Take 1500-2000 milligrams of calcium daily starting at the 20th week of pregnancy until you deliver your baby.  Try taking medicine that helps you poop (stool softener) as needed, and if your doctor approves. Eat more fiber by eating fresh fruit, vegetables, and whole grains. Drink enough fluids to keep your pee (urine) clear or pale yellow.  Take warm water baths (sitz baths) to soothe pain or discomfort caused by hemorrhoids. Use hemorrhoid cream if your doctor approves.  If you have puffy, bulging veins (varicose veins), wear support hose. Raise (elevate) your feet for 15 minutes, 3-4 times a day. Limit salt in your diet.  Avoid heavy lifting, wear low heals, and sit up straight.  Rest with your legs raised if you have leg cramps or low back pain.  Visit your dentist if you have not gone during your pregnancy. Use a soft toothbrush to brush your teeth. Be gentle when you floss.  You can have sex (intercourse) unless your doctor tells you not to.  Go to your doctor visits. Get help if:  You feel dizzy.  You have mild cramps or pressure in your lower belly (abdomen).  You have a nagging pain in your belly area.  You continue to feel sick to your stomach (nauseous), throw up (vomit), or have watery poop (diarrhea).  You have bad smelling fluid coming from your vagina.  You have pain with peeing (urination). Get help right away if:  You have a fever.  You are leaking fluid from your vagina.  You have spotting or bleeding from your  vagina.  You have severe belly cramping or pain.  You lose or gain weight rapidly.  You have trouble catching your breath and have chest pain.  You notice sudden or extreme puffiness (swelling) of your face, hands, ankles, feet, or legs.  You have not felt the baby move in over an hour.  You have severe headaches that do not go away with medicine.  You have vision changes. This information is not intended to replace advice given to you by your health care provider. Make sure you discuss any questions you have with your health care provider. Document Released: 07/21/2009 Document Revised: 10/02/2015 Document Reviewed: 06/27/2012 Elsevier Interactive Patient Education  2017 [employer]  Morning Sickness Morning sickness is when you feel sick to your stomach (nauseous) during pregnancy. You may feel sick to your stomach and throw up (vomit). You may feel sick in the morning, but you can feel this way any time of day. Some women feel very sick to their stomach and cannot stop throwing up (hyperemesis gravidarum). Follow these instructions at home:  Only take medicines as told by your doctor.  Take multivitamins as told by your doctor. Taking multivitamins before getting pregnant can stop or lessen the harshness of morning sickness.  Eat dry toast or unsalted crackers before getting out of bed.  Eat  5 to 6 small meals a day.  Eat dry and bland foods like rice and baked potatoes.  Do not drink liquids with meals. Drink between meals.  Do not eat greasy, fatty, or spicy foods.  Have someone cook for you if the smell of food causes you to feel sick or throw up.  If you feel sick to your stomach after taking prenatal vitamins, take them at night or with a snack.  Eat protein when you need a snack (nuts, yogurt, cheese).  Eat unsweetened gelatins for dessert.  Wear a bracelet used for sea sickness (acupressure wristband).  Go to a doctor that puts thin needles into certain  body points (acupuncture) to improve how you feel.  Do not smoke.  Use a humidifier to keep the air in your house free of odors.  Get lots of fresh air. Contact a doctor if:  You need medicine to feel better.  You feel dizzy or lightheaded.  You are losing weight. Get help right away if:  You feel very sick to your stomach and cannot stop throwing up.  You pass out (faint). This information is not intended to replace advice given to you by your health care provider. Make sure you discuss any questions you have with your health care provider. Document Released: 06/03/2004 Document Revised: 10/02/2015 Document Reviewed: 10/11/2012 Elsevier Interactive Patient Education  2017 [employer].  How a Baby Grows During Pregnancy Pregnancy begins when a female's sperm enters a female's egg (fertilization). This happens in one of the tubes (fallopian tubes) that connect the ovaries to the womb (uterus). The fertilized egg is called an embryo until it reaches 10 weeks. From 10 weeks until birth, it is called a fetus. The fertilized egg moves down the fallopian tube to the uterus. Then it implants into the lining of the uterus and begins to grow. The developing fetus receives oxygen and nutrients through the pregnant woman's bloodstream and the tissues that grow (placenta) to support the fetus. The placenta is the life support system for the fetus. It provides nutrition and removes waste. Learning as much as you can about your pregnancy and how your baby is developing can help you enjoy the experience. It can also make you aware of when there might be a problem and when to ask questions. How long does a typical pregnancy last? A pregnancy usually lasts 280 days, or about 40 weeks. Pregnancy is divided into three trimesters:  First trimester: 0-13 weeks.  Second trimester: 14-27 weeks.  Third trimester: 28-40 weeks.  The day when your baby is considered ready to be born (full term) is your  estimated date of delivery. How does my baby develop month by month? First month  The fertilized egg attaches to the inside of the uterus.  Some cells will form the placenta. Others will form the fetus.  The arms, legs, brain, spinal cord, lungs, and heart begin to develop.  At the end of the first month, the heart begins to beat.  Second month  The bones, inner ear, eyelids, hands, and feet form.  The genitals develop.  By the end of 8 weeks, all major organs are developing.  Third month  All of the internal organs are forming.  Teeth develop below the gums.  Bones and muscles begin to grow. The spine can flex.  The skin is transparent.  Fingernails and toenails begin to form.  Arms and legs continue to grow longer, and hands and feet develop.  The fetus is  about 3 in (7.6 cm) long.  Fourth month  The placenta is completely formed.  The external sex organs, neck, outer ear, eyebrows, eyelids, and fingernails are formed.  The fetus can hear, swallow, and move its arms and legs.  The kidneys begin to produce urine.  The skin is covered with a white waxy coating (vernix) and very fine hair (lanugo).  Fifth month  The fetus moves around more and can be felt for the first time (quickening).  The fetus starts to sleep and wake up and may begin to suck its finger.  The nails grow to the end of the fingers.  The organ in the digestive system that makes bile (gallbladder) functions and helps to digest the nutrients.  If your baby is a girl, eggs are present in her ovaries. If your baby is a boy, testicles start to move down into his scrotum.  Sixth month  The lungs are formed, but the fetus is not yet able to breathe.  The eyes open. The brain continues to develop.  Your baby has fingerprints and toe prints. Your baby's hair grows thicker.  At the end of the second trimester, the fetus is about 9 in (22.9 cm) long.  Seventh month  The fetus kicks and  stretches.  The eyes are developed enough to sense changes in light.  The hands can make a grasping motion.  The fetus responds to sound.  Eighth month  All organs and body systems are fully developed and functioning.  Bones harden and taste buds develop. The fetus may hiccup.  Certain areas of the brain are still developing. The skull remains soft.  Ninth month  The fetus gains about  lb (0.23 kg) each week.  The lungs are fully developed.  Patterns of sleep develop.  The fetus's head typically moves into a head-down position (vertex) in the uterus to prepare for birth. If the buttocks move into a vertex position instead, the baby is breech.  The fetus weighs 6-9 lbs (2.72-4.08 kg) and is 19-20 in (48.26-50.8 cm) long.  What can I do to have a healthy pregnancy and help my baby develop? Eating and Drinking  Eat a healthy diet. ? Talk with your health care provider to make sure that you are getting the nutrients that you and your baby need. ? Visit www.BuildDNA.es to learn about creating a healthy diet.  Gain a healthy amount of weight during pregnancy as advised by your health care provider. This is usually 25-35 pounds. You may need to: ? Gain more if you were underweight before getting pregnant or if you are pregnant with more than one baby. ? Gain less if you were overweight or obese when you got pregnant.  Medicines and Vitamins  Take prenatal vitamins as directed by your health care provider. These include vitamins such as folic acid, iron, calcium, and vitamin D. They are important for healthy development.  Take medicines only as directed by your health care provider. Read labels and ask a pharmacist or your health care provider whether over-the-counter medicines, supplements, and prescription drugs are safe to take during pregnancy.  Activities  Be physically active as advised by your health care provider. Ask your health care provider to recommend  activities that are safe for you to do, such as walking or swimming.  Do not participate in strenuous or extreme sports.  Lifestyle  Do not drink alcohol.  Do not use any tobacco products, including cigarettes, chewing tobacco, or electronic cigarettes. If you  need help quitting, ask your health care provider.  Do not use illegal drugs.  Safety  Avoid exposure to mercury, lead, or other heavy metals. Ask your health care provider about common sources of these heavy metals.  Avoid listeria infection during pregnancy. Follow these precautions: ? Do not eat soft cheeses or deli meats. ? Do not eat hot dogs unless they have been warmed up to the point of steaming, such as in the microwave oven. ? Do not drink unpasteurized milk.  Avoid toxoplasmosis infection during pregnancy. Follow these precautions: ? Do not change your cat's litter box, if you have a cat. Ask someone else to do this for you. ? Wear gardening gloves while working in the yard.  General Instructions  Keep all follow-up visits as directed by your health care provider. This is important. This includes prenatal care and screening tests.  Manage any chronic health conditions. Work closely with your health care provider to keep conditions, such as diabetes, under control.  How do I know if my baby is developing well? At each prenatal visit, your health care provider will do several different tests to check on your health and keep track of your baby's development. These include:  Fundal height. ? Your health care provider will measure your growing belly from top to bottom using a tape measure. ? Your health care provider will also feel your belly to determine your baby's position.  Heartbeat. ? An ultrasound in the first trimester can confirm pregnancy and show a heartbeat, depending on how far along you are. ? Your health care provider will check your baby's heart rate at every prenatal visit. ? As you get closer to  your delivery date, you may have regular fetal heart rate monitoring to make sure that your baby is not in distress.  Second trimester ultrasound. ? This ultrasound checks your baby's development. It also indicates your baby's gender.  What should I do if I have concerns about my baby's development? Always talk with your health care provider about any concerns that you may have. This information is not intended to replace advice given to you by your health care provider. Make sure you discuss any questions you have with your health care provider. Document Released: 10/13/2007 Document Revised: 10/02/2015 Document Reviewed: 10/03/2013 Elsevier Interactive Patient Education  2018 [place] of Pregnancy The first trimester of pregnancy is from week 1 until the end of week 13 (months 1 through 3). During this time, your baby will begin to develop inside you. At 6-8 weeks, the eyes and face are formed, and the heartbeat can be seen on ultrasound. At the end of 12 weeks, all the baby's organs are formed. Prenatal care is all the medical care you receive before the birth of your baby. Make sure you get good prenatal care and follow all of your doctor's instructions. Follow these instructions at home: Medicines  Take over-the-counter and prescription medicines only as told by your doctor. Some medicines are safe and some medicines are not safe during pregnancy.  Take a prenatal vitamin that contains at least 600 micrograms (mcg) of folic acid.  If you have trouble pooping (constipation), take medicine that will make your stool soft (stool softener) if your doctor approves. Eating and drinking  Eat regular, healthy meals.  Your doctor will tell you the amount of weight gain that is right for you.  Avoid raw meat and uncooked cheese.  If you feel sick to your stomach (nauseous)  or throw up (vomit): ? Eat 4 or 5 small meals a day instead of 3 large meals. ? Try eating a  few soda crackers. ? Drink liquids between meals instead of during meals.  To prevent constipation: ? Eat foods that are high in fiber, like fresh fruits and vegetables, whole grains, and beans. ? Drink enough fluids to keep your pee (urine) clear or pale yellow. Activity  Exercise only as told by your doctor. Stop exercising if you have cramps or pain in your lower belly (abdomen) or low back.  Do not exercise if it is too hot, too humid, or if you are in a place of great height (high altitude).  Try to avoid standing for long periods of time. Move your legs often if you must stand in one place for a long time.  Avoid heavy lifting.  Wear low-heeled shoes. Sit and stand up straight.  You can have sex unless your doctor tells you not to. Relieving pain and discomfort  Wear a good support bra if your breasts are sore.  Take warm water baths (sitz baths) to soothe pain or discomfort caused by hemorrhoids. Use hemorrhoid cream if your doctor says it is okay.  Rest with your legs raised if you have leg cramps or low back pain.  If you have puffy, bulging veins (varicose veins) in your legs: ? Wear support hose or compression stockings as told by your doctor. ? Raise (elevate) your feet for 15 minutes, 3-4 times a day. ? Limit salt in your food. Prenatal care  Schedule your prenatal visits by the twelfth week of pregnancy.  Write down your questions. Take them to your prenatal visits.  Keep all your prenatal visits as told by your doctor. This is important. Safety  Wear your seat belt at all times when driving.  Make a list of emergency phone numbers. The list should include numbers for family, friends, the hospital, and police and fire departments. General instructions  Ask your doctor for a referral to a local prenatal class. Begin classes no later than at the start of month 6 of your pregnancy.  Ask for help if you need counseling or if you need help with nutrition. Your  doctor can give you advice or tell you where to go for help.  Do not use hot tubs, steam rooms, or saunas.  Do not douche or use tampons or scented sanitary pads.  Do not cross your legs for long periods of time.  Avoid all herbs and alcohol. Avoid drugs that are not approved by your doctor.  Do not use any tobacco products, including cigarettes, chewing tobacco, and electronic cigarettes. If you need help quitting, ask your doctor. You may get counseling or other support to help you quit.  Avoid cat litter boxes and soil used by cats. These carry germs that can cause birth defects in the baby and can cause a loss of your baby (miscarriage) or stillbirth.  Visit your dentist. At home, brush your teeth with a soft toothbrush. Be gentle when you floss. Contact a doctor if:  You are dizzy.  You have mild cramps or pressure in your lower belly.  You have a nagging pain in your belly area.  You continue to feel sick to your stomach, you throw up, or you have watery poop (diarrhea).  You have a bad smelling fluid coming from your vagina.  You have pain when you pee (urinate).  You have increased puffiness (swelling) in your face, hands,  legs, or ankles. Get help right away if:  You have a fever.  You are leaking fluid from your vagina.  You have spotting or bleeding from your vagina.  You have very bad belly cramping or pain.  You gain or lose weight rapidly.  You throw up blood. It may look like coffee grounds.  You are around people who have [place] measles, fifth disease, or chickenpox.  You have a very bad headache.  You have shortness of breath.  You have any kind of trauma, such as from a fall or a car accident. Summary  The first trimester of pregnancy is from week 1 until the end of week 13 (months 1 through 3).  To take care of yourself and your unborn baby, you will need to eat healthy meals, take medicines only if your doctor tells you to do so, and do  activities that are safe for you and your baby.  Keep all follow-up visits as told by your doctor. This is important as your doctor will have to ensure that your baby is healthy and growing well. This information is not intended to replace advice given to you by your health care provider. Make sure you discuss any questions you have with your health care provider. Document Released: 10/13/2007 Document Revised: 05/04/2016 Document Reviewed: 05/04/2016 Elsevier Interactive Patient Education  2017 [employer]. Commonly Asked Questions During Pregnancy  Cats: A parasite can be excreted in cat feces.  To avoid exposure you need to have another person empty the little box.  If you must empty the litter box you will need to wear gloves.  Wash your hands after handling your cat.  This parasite can also be found in raw or undercooked meat so this should also be avoided.  Colds, Sore Throats, Flu: Please check your medication sheet to see what you can take for symptoms.  If your symptoms are unrelieved by these medications please call the office.  Dental Work: Most any dental work Investment banker, corporate recommends is permitted.  X-rays should only be taken during the first trimester if absolutely necessary.  Your abdomen should be shielded with a lead apron during all x-rays.  Please notify your provider prior to receiving any x-rays.  Novocaine is fine; gas is not recommended.  If your dentist requires a note from [place] prior to dental work please call the office and we will provide one for you.  Exercise: Exercise is an important part of staying healthy during your pregnancy.  You may continue most exercises you were accustomed to prior to pregnancy.  Later in your pregnancy you will most likely notice you have difficulty with activities requiring balance like riding a bicycle.  It is important that you listen to your body and avoid activities that put you at a higher risk of falling.  Adequate rest and staying well  hydrated are a must!  If you have questions about the safety of specific activities ask your provider.    Exposure to Children with illness: Try to avoid obvious exposure; report any symptoms to [place] when noted,  If you have chicken pos, red measles or mumps, you should be immune to these diseases.   Please do not take any vaccines while pregnant unless you have checked with your OB provider.  Fetal Movement: After 28 weeks we recommend you do "kick counts" twice daily.  Lie or sit down in a calm quiet environment and count your baby movements "kicks".  You should feel your baby at  least 10 times per hour.  If you have not felt 10 kicks within the first hour get up, walk around and have something sweet to eat or drink then repeat for an additional hour.  If count remains less than 10 per hour notify your provider.  Fumigating: Follow your pest control agent's advice as to how long to stay out of your home.  Ventilate the area well before re-entering.  Hemorrhoids:   Most over-the-counter preparations can be used during pregnancy.  Check your medication to see what is safe to use.  It is important to use a stool softener or fiber in your diet and to drink lots of liquids.  If hemorrhoids seem to be getting worse please call the office.   Hot Tubs:  Hot tubs Jacuzzis and saunas are not recommended while pregnant.  These increase your internal body temperature and should be avoided.  Intercourse:  Sexual intercourse is safe during pregnancy as long as you are comfortable, unless otherwise advised by your provider.  Spotting may occur after intercourse; report any bright red bleeding that is heavier than spotting.  Labor:  If you know that you are in labor, please go to the hospital.  If you are unsure, please call the office and let us help you decide what to do.  Lifting, straining, etc:  If your job requires heavy lifting or straining please check with your provider for any limitations.  Generally, you  should not lift items heavier than that you can lift simply with your hands and arms (no back muscles)  Painting:  Paint fumes do not harm your pregnancy, but may make you ill and should be avoided if possible.  Latex or water based paints have less odor than oils.  Use adequate ventilation while painting.  Permanents & Hair Color:  Chemicals in hair dyes are not recommended as they cause increase hair dryness which can increase hair loss during pregnancy.  " Highlighting" and permanents are allowed.  Dye may be absorbed differently and permanents may not hold as well during pregnancy.  Sunbathing:  Use a sunscreen, as skin burns easily during pregnancy.  Drink plenty of fluids; avoid over heating.  Tanning Beds:  Because their possible side effects are still unknown, tanning beds are not recommended.  Ultrasound Scans:  Routine ultrasounds are performed at approximately 20 weeks.  You will be able to see your baby's general anatomy an if you would like to know the gender this can usually be determined as well.  If it is questionable when you conceived you may also receive an ultrasound early in your pregnancy for dating purposes.  Otherwise ultrasound exams are not routinely performed unless there is a medical necessity.  Although you can request a scan we ask that you pay for it when conducted because insurance does not cover " patient request" scans.  Work: If your pregnancy proceeds without complications you may work until your due date, unless your physician or employer advises otherwise.  Round Ligament Pain/Pelvic Discomfort:  Sharp, shooting pains not associated with bleeding are fairly common, usually occurring in the second trimester of pregnancy.  They tend to be worse when standing up or when you remain standing for long periods of time.  These are the result of pressure of certain pelvic ligaments called "round ligaments".  Rest, Tylenol and heat seem to be the most effective relief.  As  the womb and fetus grow, they rise out of the pelvis and the discomfort improves.  Please notify the office if your pain seems different than that described.  It may represent a more serious condition.  Common Medications Safe in Pregnancy  Acne:      Constipation:  Benzoyl Peroxide     Colace  Clindamycin      Dulcolax Suppository  Topica Erythromycin     Fibercon  Salicylic Acid      Metamucil         Miralax AVOID:        Senakot   Accutane    Cough:  Retin-A       Cough Drops  Tetracycline      Phenergan w/ Codeine if Rx  Minocycline      Robitussin (Plain & DM)  Antibiotics:     Crabs/Lice:  Ceclor       RID  Cephalosporins    AVOID:  E-Mycins      Kwell  Keflex  Macrobid/Macrodantin   Diarrhea:  Penicillin      Kao-Pectate  Zithromax      Imodium AD         PUSH FLUIDS AVOID:       Cipro     Fever:  Tetracycline      Tylenol (Regular or Extra  Minocycline       Strength)  Levaquin      Extra Strength-Do not          Exceed 8 tabs/24 hrs Caffeine:        '200mg'$ /day (equiv. To 1 cup of coffee or  approx. 3 12 oz sodas)         Gas: Cold/Hayfever:       Gas-X  Benadryl      Mylicon  Claritin       Phazyme  **Claritin-D        Chlor-Trimeton    Headaches:  Dimetapp      ASA-Free Excedrin  Drixoral-Non-Drowsy     Cold Compress  Mucinex (Guaifenasin)     Tylenol (Regular or Extra  Sudafed/Sudafed-12 Hour     Strength)  **Sudafed PE Pseudoephedrine   Tylenol Cold & Sinus     Vicks Vapor Rub  Zyrtec  **AVOID if Problems With Blood Pressure         Heartburn: Avoid lying down for at least 1 hour after meals  Aciphex      Maalox     Rash:  Milk of Magnesia     Benadryl    Mylanta       1% Hydrocortisone Cream  Pepcid  Pepcid Complete   Sleep Aids:  Prevacid      Ambien   Prilosec       Benadryl  Rolaids       Chamomile Tea  Tums (Limit 4/day)     Unisom  Zantac       Tylenol PM         Warm milk-add vanilla or  Hemorrhoids:       Sugar for  taste  Anusol/Anusol H.C.  (RX: Analapram 2.5%)  Sugar Substitutes:  Hydrocortisone OTC     Ok in moderation  Preparation H      Tucks        Vaseline lotion applied to tissue with wiping    Herpes:     Throat:  Acyclovir      Oragel  Famvir  Valtrex     Vaccines:         Flu Shot Leg Cramps:       *Gardasil  Benadryl      Hepatitis A         Hepatitis B Nasal Spray:       Pneumovax  Saline Nasal Spray     Polio Booster         Tetanus Nausea:       Tuberculosis test or PPD  Vitamin B6 25 mg TID   AVOID:    Dramamine      *Gardasil  Emetrol       Live Poliovirus  Ginger Root 250 mg QID    MMR (measles, mumps &  High Complex Carbs @ Bedtime    rebella)  Sea Bands-Accupressure    Varicella (Chickenpox)  Unisom 1/2 tab TID     *No known complications           If received before Pain:         Known pregnancy;   Darvocet       Resume series after  Lortab        Delivery  Percocet    Yeast:   Tramadol      Femstat  Tylenol 3      Gyne-lotrimin  Ultram       Monistat  Vicodin           MISC:         All Sunscreens           Hair Coloring/highlights          Insect Repellant's          (Including DEET)         Mystic Tans

## 2018-02-13 ENCOUNTER — Ambulatory Visit (INDEPENDENT_AMBULATORY_CARE_PROVIDER_SITE_OTHER): Payer: BLUE CROSS/BLUE SHIELD | Admitting: Obstetrics and Gynecology

## 2018-02-13 ENCOUNTER — Other Ambulatory Visit: Payer: Self-pay

## 2018-02-13 VITALS — BP 102/72 | HR 75 | Ht 63.0 in | Wt 141.0 lb

## 2018-02-13 DIAGNOSIS — Z202 Contact with and (suspected) exposure to infections with a predominantly sexual mode of transmission: Secondary | ICD-10-CM

## 2018-02-13 DIAGNOSIS — Z3481 Encounter for supervision of other normal pregnancy, first trimester: Secondary | ICD-10-CM

## 2018-02-13 MED ORDER — CITRANATAL BLOOM 90-1 MG PO TABS: 1.0000 mg | ORAL_TABLET | Freq: Once | ORAL | 6 refills | Status: AC

## 2018-02-14 LAB — MICROSCOPIC EXAMINATION
Casts: NONE SEEN /lpf
Epithelial Cells (non renal): >10 /hpf — AB (ref 0–10)

## 2018-02-14 LAB — DRUG PROFILE, UR, 9 DRUGS (LABCORP)
Amphetamines, Urine: NEGATIVE ng/mL
BARBITURATE QUANT UR: NEGATIVE ng/mL
Benzodiazepine Quant, Ur: NEGATIVE ng/mL
CANNABINOID QUANT UR: NEGATIVE ng/mL
COCAINE (METAB.): NEGATIVE ng/mL
Methadone Screen, Urine: NEGATIVE ng/mL
OPIATE QUANT UR: NEGATIVE ng/mL
PCP QUANT UR: NEGATIVE ng/mL
Propoxyphene: NEGATIVE ng/mL

## 2018-02-14 LAB — RUBELLA SCREEN: Rubella Antibodies, IGG: 1.75 index (ref >0.99)

## 2018-02-14 LAB — GC/CHLAMYDIA PROBE AMP
CHLAMYDIA, DNA PROBE: NEGATIVE
Neisseria gonorrhoeae by PCR: NEGATIVE

## 2018-02-14 LAB — NICOTINE SCREEN, URINE: COTININE UR QL SCN: NEGATIVE ng/mL

## 2018-02-14 LAB — URINALYSIS, ROUTINE W REFLEX MICROSCOPIC
BILIRUBIN UA: NEGATIVE
GLUCOSE, UA: NEGATIVE
Ketones, UA: NEGATIVE
NITRITE UA: NEGATIVE
PH UA: 8 — AB (ref 5.0–7.5)
PROTEIN UA: NEGATIVE
RBC, UA: NEGATIVE
SPEC GRAV UA: 1.023 (ref 1.005–1.030)
Urobilinogen, Ur: 0.2 mg/dL (ref 0.2–1.0)

## 2018-02-14 LAB — RPR: RPR Ser Ql: NONREACTIVE

## 2018-02-14 LAB — ABO AND RH: Rh Factor: POSITIVE

## 2018-02-14 LAB — HIV ANTIBODY (ROUTINE TESTING W REFLEX): HIV Screen 4th Generation wRfx: NONREACTIVE

## 2018-02-14 LAB — HEPATITIS B SURFACE ANTIGEN: HEP B S AG: NEGATIVE

## 2018-02-14 LAB — PARVOVIRUS B19 ANTIBODY, IGG AND IGM
PARVOVIRUS B19 IGM: 0.1 {index} (ref 0.0–0.8)
Parvovirus B19 IgG: 2.4 index — ABNORMAL HIGH (ref 0.0–0.8)

## 2018-02-14 LAB — HGB SOLU + RFLX FRAC: Sickle Solubility Test - HGBRFX: NEGATIVE

## 2018-02-14 LAB — VARICELLA ZOSTER ANTIBODY, IGG: VARICELLA: 2092 {index} (ref >165)

## 2018-02-15 LAB — URINE CULTURE, OB REFLEX

## 2018-02-15 LAB — CULTURE, OB URINE

## 2018-02-22 ENCOUNTER — Ambulatory Visit (INDEPENDENT_AMBULATORY_CARE_PROVIDER_SITE_OTHER): Payer: BLUE CROSS/BLUE SHIELD | Admitting: Certified Nurse Midwife

## 2018-02-22 ENCOUNTER — Other Ambulatory Visit (HOSPITAL_COMMUNITY)
Admission: RE | Admit: 2018-02-22 | Discharge: 2018-02-22 | Disposition: A | Discharge status: Home / Self Care | Payer: BLUE CROSS/BLUE SHIELD | Source: Ambulatory Visit | Attending: Certified Nurse Midwife | Admitting: Certified Nurse Midwife

## 2018-02-22 ENCOUNTER — Encounter: Payer: Self-pay | Admitting: Certified Nurse Midwife

## 2018-02-22 VITALS — BP 100/57 | HR 79 | Wt 143.3 lb

## 2018-02-22 DIAGNOSIS — O09511 Supervision of elderly primigravida, first trimester: Secondary | ICD-10-CM | POA: Insufficient documentation

## 2018-02-22 DIAGNOSIS — Z3401 Encounter for supervision of normal first pregnancy, first trimester: Secondary | ICD-10-CM | POA: Insufficient documentation

## 2018-02-22 DIAGNOSIS — Z3481 Encounter for supervision of other normal pregnancy, first trimester: Secondary | ICD-10-CM | POA: Diagnosis present

## 2018-02-22 DIAGNOSIS — Z31438 Encounter for other genetic testing of female for procreative management: Secondary | ICD-10-CM | POA: Diagnosis not present

## 2018-02-22 DIAGNOSIS — Z3A12 12 weeks gestation of pregnancy: Secondary | ICD-10-CM | POA: Insufficient documentation

## 2018-02-22 LAB — POCT URINALYSIS DIPSTICK OB
Bilirubin, UA: NEGATIVE
Blood, UA: NEGATIVE
Glucose, UA: NEGATIVE
KETONES UA: NEGATIVE
LEUKOCYTES UA: NEGATIVE
NITRITE UA: NEGATIVE
PH UA: 6.5 (ref 5.0–8.0)
PROTEIN: NEGATIVE
Urobilinogen, UA: 0.2 E.U./dL

## 2018-02-22 NOTE — Addendum Note (Signed)
Addended by: Brooke Dare on: 02/22/2018 11:35 AM   Modules accepted: Orders

## 2018-02-22 NOTE — Addendum Note (Signed)
Addended by: Brooke Dare on: 02/22/2018 11:14 AM   Modules accepted: Orders

## 2018-02-22 NOTE — Progress Notes (Signed)
NEW OB HISTORY AND PHYSICAL  SUBJECTIVE:       Deanna Hutchinson is a 37 y.o. [id number] female, No LMP recorded. Patient is pregnant., Estimated Date of Delivery: 09/06/18, [redacted]w[redacted]d, presents today for establishment of Prenatal Care. She has no unusual complaints and complains of nausea that is getting better. No longer taking medication    Gynecologic History No LMP recorded. Patient is pregnant. Normal Contraception: none Last Pap: 04/07/17. Results were: LSIL, HPV negative  Obstetric History OB History  Gravida Para Term Preterm AB Living  3 2 2  0 0 2  SAB TAB Ectopic Multiple Live Births  0 0 0 0 0    # Outcome Date GA Lbr Len/2nd Weight Sex Delivery Anes PTL Lv  3 Current           2 Term           1 Term             History reviewed. No pertinent past medical history.  Past Surgical History:  Procedure Laterality Date  . BREAST SURGERY      Current Outpatient Medications on File Prior to Visit  Medication Sig Dispense Refill  . Prenatal Vit-Fe Fumarate-FA (PRENATAL MULTIVITAMIN) TABS tablet Take 1 tablet by mouth daily at 12 noon.     No current facility-administered medications on file prior to visit.     No Known Allergies  Social History   Socioeconomic History  . Marital status: Married    Spouse name: Not on file  . Number of children: Not on file  . Years of education: Not on file  . Highest education level: Not on file  Occupational History  . Not on file  Social Needs  . Financial resource strain: Not on file  . Food insecurity:    Worry: Not on file    Inability: Not on file  . Transportation needs:    Medical: Not on file    Non-medical: Not on file  Tobacco Use  . Smoking status: Never Smoker  . Smokeless tobacco: Never Used  Substance and Sexual Activity  . Alcohol use: No  . Drug use: No  . Sexual activity: Not Currently  Lifestyle  . Physical activity:    Days per week: Not on file    Minutes per session: Not on file  . Stress: Not on file   Relationships  . Social connections:    Talks on phone: Not on file    Gets together: Not on file    Attends religious service: Not on file    Active member of club or organization: Not on file    Attends meetings of clubs or organizations: Not on file    Relationship status: Not on file  . Intimate partner violence:    Fear of current or ex partner: Not on file    Emotionally abused: Not on file    Physically abused: Not on file    Forced sexual activity: Not on file  Other Topics Concern  . Not on file  Social History Narrative  . Not on file    Family History  Problem Relation Age of Onset  . Diabetes Mother     The following portions of the patient's history were reviewed and updated as appropriate: allergies, current medications, past OB history, past medical history, past surgical history, past family history, past social history, and problem list.    OBJECTIVE: Initial Physical Exam (New OB)  GENERAL APPEARANCE: alert, well appearing, in no  apparent distress, oriented to person, place and time HEAD: normocephalic, atraumatic MOUTH: mucous membranes moist, pharynx normal without lesions THYROID: no thyromegaly or masses present BREASTS: no masses noted, no significant tenderness, no palpable axillary nodes, no skin changes LUNGS: clear to auscultation, no wheezes, rales or rhonchi, symmetric air entry HEART: regular rate and rhythm, no murmurs ABDOMEN: soft, nontender, nondistended, no abnormal masses, no epigastric pain, FHT 156 EXTREMITIES: no redness or tenderness in the calves or thighs SKIN: normal coloration and turgor, no rashes LYMPH NODES: no adenopathy palpable NEUROLOGIC: alert, oriented, normal speech, no focal findings or movement disorder noted  PELVIC EXAM EXTERNAL GENITALIA: normal appearing vulva with no masses, tenderness or lesions VAGINA: no abnormal discharge or lesions CERVIX: no lesions or cervical motion tenderness UTERUS: gravid ADNEXA:  no masses palpable and nontender OB EXAM PELVIMETRY: appears adequate RECTUM: exam not indicated  ASSESSMENT: Normal pregnancy  PLAN: New OB counseling: The patient has been given an overview regarding routine prenatal care. Recommendations regarding diet, weight gain, and exercise in pregnancy were given. Prenatal testing, optional genetic testing, and ultrasound use in pregnancy were reviewed. Undecided regarding genetic testing. Benefits of Breast Feeding were discussed. The patient is encouraged to consider nursing her baby post partum. Follow up 4 wks.   Doreene Burke, CNm

## 2018-02-22 NOTE — Patient Instructions (Signed)
Prenatal Care WHAT IS PRENATAL CARE? Prenatal care is the process of caring for a pregnant woman before she gives birth. Prenatal care makes sure that she and her baby remain as healthy as possible throughout pregnancy. Prenatal care may be provided by a midwife, family practice health care provider, or a childbirth and pregnancy specialist (obstetrician). Prenatal care may include physical examinations, testing, treatments, and education on nutrition, lifestyle, and social support services. WHY IS PRENATAL CARE SO IMPORTANT? Early and consistent prenatal care increases the chance that you and your baby will remain healthy throughout your pregnancy. This type of care also decreases a baby's risk of being born too early (prematurely), or being born smaller than expected (small for gestational age). Any underlying medical conditions you may have that could pose a risk during your pregnancy are discussed during prenatal care visits. You will also be monitored regularly for any new conditions that may arise during your pregnancy so they can be treated quickly and effectively. WHAT HAPPENS DURING PRENATAL CARE VISITS? Prenatal care visits may include the following: Discussion Tell your health care provider about any new signs or symptoms you have experienced since your last visit. These might include:  Nausea or vomiting.  Increased or decreased level of energy.  Difficulty sleeping.  Back or leg pain.  Weight changes.  Frequent urination.  Shortness of breath with physical activity.  Changes in your skin, such as the development of a rash or itchiness.  Vaginal discharge or bleeding.  Feelings of excitement or nervousness.  Changes in your baby's movements.  You may want to write down any questions or topics you want to discuss with your health care provider and bring them with you to your appointment. Examination During your first prenatal care visit, you will likely have a complete  physical exam. Your health care provider will often examine your vagina, cervix, and the position of your uterus, as well as check your heart, lungs, and other body systems. As your pregnancy progresses, your health care provider will measure the size of your uterus and your baby's position inside your uterus. He or she may also examine you for early signs of labor. Your prenatal visits may also include checking your blood pressure and, after about 10-12 weeks of pregnancy, listening to your baby's heartbeat. Testing Regular testing often includes:  Urinalysis. This checks your urine for glucose, protein, or signs of infection.  Blood count. This checks the levels of white and red blood cells in your body.  Tests for sexually transmitted infections (STIs). Testing for STIs at the beginning of pregnancy is routinely done and is required in many states.  Antibody testing. You will be checked to see if you are immune to certain illnesses, such as rubella, that can affect a developing fetus.  Glucose screen. Around 24-28 weeks of pregnancy, your blood glucose level will be checked for signs of gestational diabetes. Follow-up tests may be recommended.  Group B strep. This is a bacteria that is commonly found inside a woman's vagina. This test will inform your health care provider if you need an antibiotic to reduce the amount of this bacteria in your body prior to labor and childbirth.  Ultrasound. Many pregnant women undergo an ultrasound screening around 18-20 weeks of pregnancy to evaluate the health of the fetus and check for any developmental abnormalities.  HIV (human immunodeficiency virus) testing. Early in your pregnancy, you will be screened for HIV. If you are at high risk for HIV, this test may   be repeated during your third trimester of pregnancy.  You may be offered other testing based on your age, personal or family medical history, or other factors. HOW OFTEN SHOULD I PLAN TO SEE MY  HEALTH CARE PROVIDER FOR PRENATAL CARE? Your prenatal care check-up schedule depends on any medical conditions you have before, or develop during, your pregnancy. If you do not have any underlying medical conditions, you will likely be seen for checkups:  Monthly, during the first 6 months of pregnancy.  Twice a month during months 7 and 8 of pregnancy.  Weekly starting in the 9th month of pregnancy and until delivery.  If you develop signs of early labor or other concerning signs or symptoms, you may need to see your health care provider more often. Ask your health care provider what prenatal care schedule is best for you. WHAT CAN I DO TO KEEP MYSELF AND MY BABY AS HEALTHY AS POSSIBLE DURING MY PREGNANCY?  Take a prenatal vitamin containing 400 micrograms (0.4 mg) of folic acid every day. Your health care provider may also ask you to take additional vitamins such as iodine, vitamin D, iron, copper, and zinc.  Take 1500-2000 mg of calcium daily starting at your 20th week of pregnancy until you deliver your baby.  Make sure you are up to date on your vaccinations. Unless directed otherwise by your health care provider: ? You should receive a tetanus, diphtheria, and pertussis (Tdap) vaccination between the 27th and 36th week of your pregnancy, regardless of when your last Tdap immunization occurred. This helps protect your baby from whooping cough (pertussis) after he or she is born. ? You should receive an annual inactivated influenza vaccine (IIV) to help protect you and your baby from influenza. This can be done at any point during your pregnancy.  Eat a well-rounded diet that includes: ? Fresh fruits and vegetables. ? Lean proteins. ? Calcium-rich foods such as milk, yogurt, hard cheeses, and dark, leafy greens. ? Whole grain breads.  Do noteat seafood high in mercury, including: ? Swordfish. ? Tilefish. ? Shark. ? King mackerel. ? More than 6 oz tuna per week.  Do not  eat: ? Raw or undercooked meats or eggs. ? Unpasteurized foods, such as soft cheeses (brie, blue, or feta), juices, and milks. ? Lunch meats. ? Hot dogs that have not been heated until they are steaming.  Drink enough water to keep your urine clear or pale yellow. For many women, this may be 10 or more 8 oz glasses of water each day. Keeping yourself hydrated helps deliver nutrients to your baby and may prevent the start of pre-term uterine contractions.  Do not use any tobacco products including cigarettes, chewing tobacco, or electronic cigarettes. If you need help quitting, ask your health care provider.  Do not drink beverages containing alcohol. No safe level of alcohol consumption during pregnancy has been determined.  Do not use any illegal drugs. These can harm your developing baby or cause a miscarriage.  Ask your health care provider or pharmacist before taking any prescription or over-the-counter medicines, herbs, or supplements.  Limit your caffeine intake to no more than 200 mg per day.  Exercise. Unless told otherwise by your health care provider, try to get 30 minutes of moderate exercise most days of the week. Do not  do high-impact activities, contact sports, or activities with a high risk of falling, such as horseback riding or downhill skiing.  Get plenty of rest.  Avoid anything that raises your  body temperature, such as hot tubs and saunas.  If you own a cat, do not empty its litter box. Bacteria contained in cat feces can cause an infection called toxoplasmosis. This can result in serious harm to the fetus.  Stay away from chemicals such as insecticides, lead, mercury, and cleaning or paint products that contain solvents.  Do not have any X-rays taken unless medically necessary.  Take a childbirth and breastfeeding preparation class. Ask your health care provider if you need a referral or recommendation.  This information is not intended to replace advice given  to you by your health care provider. Make sure you discuss any questions you have with your health care provider. Document Released: 04/29/2003 Document Revised: 09/29/2015 Document Reviewed: 07/11/2013 Elsevier Interactive Patient Education  2017 [employer] Eating Plan for Pregnant Women While you are pregnant, your body will require additional nutrition to help support your growing baby. It is recommended that you consume:  150 additional calories each day during your first trimester.  300 additional calories each day during your second trimester.  300 additional calories each day during your third trimester.  Eating a healthy, well-balanced diet is very important for your health and for your baby's health. You also have a higher need for some vitamins and minerals, such as folic acid, calcium, iron, and vitamin D. What do I need to know about eating during pregnancy?  Do not try to lose weight or go on a diet during pregnancy.  Choose healthy, nutritious foods. Choose  of a sandwich with a glass of milk instead of a candy bar or a high-calorie sugar-sweetened beverage.  Limit your overall intake of foods that have "empty calories." These are foods that have little nutritional value, such as sweets, desserts, candies, sugar-sweetened beverages, and fried foods.  Eat a variety of foods, especially fruits and vegetables.  Take a prenatal vitamin to help meet the additional needs during pregnancy, specifically for folic acid, iron, calcium, and vitamin D.  Remember to stay active. Ask your health care provider for exercise recommendations that are specific to you.  Practice good food safety and cleanliness, such as washing your hands before you eat and after you prepare raw meat. This helps to prevent foodborne illnesses, such as listeriosis, that can be very dangerous for your baby. Ask your health care provider for more information about listeriosis. What does 150 extra calories  look like? Healthy options for an additional 150 calories each day could be any of the following:  Plain low-fat yogurt (6-8 oz) with  cup of berries.  1 apple with 2 teaspoons of peanut butter.  Cut-up vegetables with  cup of hummus.  Low-fat chocolate milk (8 oz or 1 cup).  1 string cheese with 1 medium orange.   of a peanut butter and jelly sandwich on whole-wheat bread (1 tsp of peanut butter).  For 300 calories, you could eat two of those healthy options each day. What is a healthy amount of weight to gain? The recommended amount of weight for you to gain is based on your pre-pregnancy BMI. If your pre-pregnancy BMI was:  Less than 18 (underweight), you should gain 28-40 lb.  18-24.9 (normal), you should gain 25-35 lb.  25-29.9 (overweight), you should gain 15-25 lb.  Greater than 30 (obese), you should gain 11-20 lb.  What if I am having twins or multiples? Generally, pregnant women who will be having twins or multiples may need to increase their daily calories by 300-600 calories each   day. The recommended range for total weight gain is 25-54 lb, depending on your pre-pregnancy BMI. Talk with your health care provider for specific guidance about additional nutritional needs, weight gain, and exercise during your pregnancy. What foods can I eat? Grains Any grains. Try to choose whole grains, such as whole-wheat bread, oatmeal, or brown rice. Vegetables Any vegetables. Try to eat a variety of colors and types of vegetables to get a full range of vitamins and minerals. Remember to wash your vegetables well before eating. Fruits Any fruits. Try to eat a variety of colors and types of fruit to get a full range of vitamins and minerals. Remember to wash your fruits well before eating. Meats and Other Protein Sources Lean meats, including chicken, turkey, fish, and lean cuts of beef, veal, or pork. Make sure that all meats are cooked to "well done." Tofu. Tempeh. Beans. Eggs.  Peanut butter and other nut butters. Seafood, such as shrimp, crab, and lobster. If you choose fish, select types that are higher in omega-3 fatty acids, including salmon, herring, mussels, trout, sardines, and pollock. Make sure that all meats are cooked to food-safe temperatures. Dairy Pasteurized milk and milk alternatives. Pasteurized yogurt and pasteurized cheese. Cottage cheese. Sour cream. Beverages Water. Juices that contain 100% fruit juice or vegetable juice. Caffeine-free teas and decaffeinated coffee. Drinks that contain caffeine are okay to drink, but it is better to avoid caffeine. Keep your total caffeine intake to less than 200 mg each day (12 oz of coffee, tea, or soda) or as directed by your health care provider. Condiments Any pasteurized condiments. Sweets and Desserts Any sweets and desserts. Fats and Oils Any fats and oils. The items listed above may not be a complete list of recommended foods or beverages. Contact your dietitian for more options. What foods are not recommended? Vegetables Unpasteurized (raw) vegetable juices. Fruits Unpasteurized (raw) fruit juices. Meats and Other Protein Sources Cured meats that have nitrates, such as bacon, salami, and hotdogs. Luncheon meats, bologna, or other deli meats (unless they are reheated until they are steaming hot). Refrigerated pate, meat spreads from a meat counter, smoked seafood that is found in the refrigerated section of a store. Raw fish, such as sushi or sashimi. High mercury content fish, such as tilefish, shark, swordfish, and king mackerel. Raw meats, such as tuna or beef tartare. Undercooked meats and poultry. Make sure that all meats are cooked to food-safe temperatures. Dairy Unpasteurized (raw) milk and any foods that have raw milk in them. Soft cheeses, such as feta, queso blanco, queso fresco, Brie, Camembert cheeses, blue-veined cheeses, and Panela cheese (unless it is made with pasteurized milk, which must  be stated on the label). Beverages Alcohol. Sugar-sweetened beverages, such as sodas, teas, or energy drinks. Condiments Homemade fermented foods and drinks, such as pickles, sauerkraut, or kombucha drinks. (Store-bought pasteurized versions of these are okay.) Other Salads that are made in the store, such as ham salad, chicken salad, egg salad, tuna salad, and seafood salad. The items listed above may not be a complete list of foods and beverages to avoid. Contact your dietitian for more information. This information is not intended to replace advice given to you by your health care provider. Make sure you discuss any questions you have with your health care provider. Document Released: 02/08/2014 Document Revised: 10/02/2015 Document Reviewed: 10/09/2013 Elsevier Interactive Patient Education  2018 [employer] Inc[employer]  

## 2018-02-23 LAB — CBC
Hematocrit: 31.9 % — ABNORMAL LOW (ref 34.0–46.6)
Hemoglobin: 10.5 g/dL — ABNORMAL LOW (ref 11.1–15.9)
MCH: 28.3 pg (ref 26.6–33.0)
MCHC: 32.9 g/dL (ref 31.5–35.7)
MCV: 86 fL (ref 79–97)
PLATELETS: 244 10*3/uL (ref 150–450)
RBC: 3.71 x10E6/uL — AB (ref 3.77–5.28)
RDW: 13.3 % (ref 12.3–15.4)
WBC: 7.1 10*3/uL (ref 3.4–10.8)

## 2018-03-01 LAB — CYTOLOGY - PAP
Diagnosis: UNDETERMINED — AB
HPV (WINDOPATH): DETECTED — AB
HPV 16/18/45 GENOTYPING: NEGATIVE

## 2018-03-06 ENCOUNTER — Telehealth: Payer: Self-pay

## 2018-03-06 NOTE — Telephone Encounter (Signed)
Message left on pts voicemail- information on colposcopy and abnormal pap smear will be mailed to her. Also to return my call if she has any questions or concerns.

## 2018-03-22 ENCOUNTER — Encounter: Payer: Self-pay | Admitting: Obstetrics and Gynecology

## 2018-03-22 ENCOUNTER — Ambulatory Visit (INDEPENDENT_AMBULATORY_CARE_PROVIDER_SITE_OTHER): Payer: BLUE CROSS/BLUE SHIELD | Admitting: Obstetrics and Gynecology

## 2018-03-22 VITALS — BP 116/60 | HR 83 | Wt 146.1 lb

## 2018-03-22 DIAGNOSIS — Z23 Encounter for immunization: Secondary | ICD-10-CM | POA: Diagnosis not present

## 2018-03-22 DIAGNOSIS — Z3492 Encounter for supervision of normal pregnancy, unspecified, second trimester: Secondary | ICD-10-CM

## 2018-03-22 LAB — POCT URINALYSIS DIPSTICK OB
BILIRUBIN UA: NEGATIVE
Ketones, UA: NEGATIVE
LEUKOCYTES UA: NEGATIVE
Nitrite, UA: NEGATIVE
PH UA: 6.5 (ref 5.0–8.0)
POC,PROTEIN,UA: NEGATIVE
RBC UA: NEGATIVE
SPEC GRAV UA: 1.015 (ref 1.010–1.025)
UROBILINOGEN UA: 0.2 U/dL

## 2018-03-22 NOTE — Progress Notes (Signed)
[name]- doing well, flu vaccine given, discussed pap findings, anatomy scan next visit, afp & maternity 21 obtained. Will schedule coplo.

## 2018-03-22 NOTE — Addendum Note (Signed)
Addended by: Rosine BeatLONTZ, Kenyon Eshleman L on: 03/22/2018 03:19 PM   Modules accepted: Orders

## 2018-03-22 NOTE — Progress Notes (Signed)
[name]- pt is doing well, flu vaccine given, wants to discuss her pap results and possible genetic testing

## 2018-03-28 LAB — MATERNIT 21 PLUS CORE, BLOOD
CHROMOSOME 13: NEGATIVE
CHROMOSOME 18: NEGATIVE
Chromosome 21: NEGATIVE
Y CHROMOSOME: NOT DETECTED

## 2018-03-29 ENCOUNTER — Other Ambulatory Visit: Payer: Self-pay | Admitting: Obstetrics and Gynecology

## 2018-03-29 ENCOUNTER — Telehealth: Payer: Self-pay | Admitting: Obstetrics and Gynecology

## 2018-03-29 DIAGNOSIS — O280 Abnormal hematological finding on antenatal screening of mother: Secondary | ICD-10-CM

## 2018-03-29 NOTE — Telephone Encounter (Signed)
The patient called very very upset and crying, asking if she could please speak to Melody about doing testing tomorrow and stated, "I can't sit here and wait until Monday, I just can't!"  She stated that if another hospital other than [facility] can do the testing, she is willing to do there, but she is not willing to wait and she said, "I need to know what is going on."  Her call back number is verified as [phone], and she was crying, and verbally distraught, please advise, thanks.

## 2018-03-30 ENCOUNTER — Other Ambulatory Visit: Payer: Self-pay | Admitting: Obstetrics and Gynecology

## 2018-03-30 ENCOUNTER — Ambulatory Visit
Admission: RE | Admit: 2018-03-30 | Discharge: 2018-03-30 | Disposition: A | Discharge status: Home / Self Care | Payer: BLUE CROSS/BLUE SHIELD | Source: Ambulatory Visit | Attending: Obstetrics and Gynecology | Admitting: Obstetrics and Gynecology

## 2018-03-30 ENCOUNTER — Ambulatory Visit (HOSPITAL_BASED_OUTPATIENT_CLINIC_OR_DEPARTMENT_OTHER)
Admission: RE | Admit: 2018-03-30 | Discharge: 2018-03-30 | Disposition: A | Discharge status: Home / Self Care | Payer: BLUE CROSS/BLUE SHIELD | Source: Ambulatory Visit | Attending: Obstetrics and Gynecology | Admitting: Obstetrics and Gynecology

## 2018-03-30 ENCOUNTER — Other Ambulatory Visit: Payer: Self-pay

## 2018-03-30 VITALS — BP 114/70 | HR 84 | Temp 98.2°F | Resp 18 | Ht 63.6 in | Wt 146.6 lb

## 2018-03-30 DIAGNOSIS — O280 Abnormal hematological finding on antenatal screening of mother: Secondary | ICD-10-CM | POA: Insufficient documentation

## 2018-03-30 DIAGNOSIS — O289 Unspecified abnormal findings on antenatal screening of mother: Secondary | ICD-10-CM

## 2018-03-30 DIAGNOSIS — Z3A17 17 weeks gestation of pregnancy: Secondary | ICD-10-CM | POA: Insufficient documentation

## 2018-03-30 DIAGNOSIS — O09522 Supervision of elderly multigravida, second trimester: Secondary | ICD-10-CM | POA: Insufficient documentation

## 2018-03-30 DIAGNOSIS — Z8279 Family history of other congenital malformations, deformations and chromosomal abnormalities: Secondary | ICD-10-CM | POA: Insufficient documentation

## 2018-03-30 LAB — AFP TETRA
DIA Mom Value: 0.56
DIA Value (EIA): 99.17 pg/mL
DSR (By Age)    1 IN: 105
DSR (Second Trimester) 1 IN: 270
GESTATIONAL AGE AFP: 19 wk
MATERNAL AGE AT EDD: 39.3 a
MSAFP Mom: 1.02
MSAFP: 51.1 ng/mL
MSHCG Mom: 1.69
MSHCG: 43861 m[IU]/mL
OSB RISK: 10000
T18 (By Age): 1:411 {titer}
Test Results:: POSITIVE — AB
UE3 MOM: 0.58
UE3 VALUE: 0.97 ng/mL
Weight: 146 [lb_av]

## 2018-03-30 LAB — CYSTIC FIBROSIS MUTATION 97: Interpretation: NOT DETECTED

## 2018-03-30 NOTE — Telephone Encounter (Signed)
Called pt, [facility] is going to call pt 03/30/18 and try to see pt in an emergent spot

## 2018-03-30 NOTE — Progress Notes (Addendum)
Referring Provider:  [facility] 40 minute consultation  Ms. Wadley was referred to [place] for genetic counseling because of maternal age and a positive result on the AFP tetra.  The patient will be 37 years old at the time of delivery.  This note summarizes the information we discussed.    We explained that the chance of a chromosome abnormality increases with maternal age.  Chromosomes and examples of chromosome problems were reviewed.  Humans typically have 46 chromosomes in each cell, with half passed through each sperm and egg.  Any change in the number or structure of chromosomes can increase the risk of problems in the physical and mental development of a pregnancy.   Based upon age of the patient, the chance of any chromosome abnormality was 1 in 78.  The chance of Down syndrome, the most common chromosome problem associated with maternal age, was 1 in 53.  The risk of chromosome problems is in addition to the 3% general population risk for birth defects and mental retardation.  The greatest chance, of course, is that the baby would be born in good health.  We discussed the following prenatal screening and testing options for this pregnancy:  Prior to this visit, Ms. Dorame had circulating cell free fetal DNA testing ordered through [facility] (MaterniT21).  This testing may be used to determine whether or not the baby may have either Down syndrome, trisomy 17, or trisomy 51. This test utilizes a maternal blood sample and DNA sequencing technology to isolate circulating cell free fetal DNA from maternal plasma. The fetal DNA can then be analyzed for DNA sequences that are derived from the three most common chromosomes involved in aneuploidy, chromosomes 13, 18, and 21. If the overall amount of DNA is greater than the expected level for any of these chromosomes, aneuploidy is suspected. This testing is able to provide another means of determining the chance  for one of these common chromosome conditions, without requiring an invasive procedure and traditional karyotype analysis. The patient was informed of the results of her recent MaterniT21 testing (performed at [facility]) which yielded NEGATIVE results.  The patient's specimen showed no evidence of aneuploidy.  The detection rate for trisomy 21 is greater than 99%, for trisomy 18 is greater than 99%, and for trisomy 13 is greater than 91%.  Sex chromosome analysis was included to assess presence or absence of Y chromosome DNA.  There was no Y chromosome DNA detected, which means the baby is expected to be female.  While the results of this testing are highly accurate, they are not considered diagnostic.  Should more definitive information be desired, the patient may still consider amniocentesis.  Subsequently, [facility] also ordered the AFP tetra screening test.  It is typical to order an AFP only to assess the risk for an open neural tube defect following MaterniT21 testing.  However, the AFP tetra is used to evaluate for neural tube defects as well as Down syndrome and trisomy 18.  The results showed an increased risk of 1 in 270 for Down syndrome.  Results were within normal limits for open spina bifida as well as trisomy 98. This number is reduced compared to her age-related risk but is above the cutoff for this laboratory.    This new abnormal result caused a great deal of worry for [name].  We explained that the MaterniT21 is a more accurate test than the AFP tetra, and that clinically the MaterniT21 results  would be the more reliable result.  We did also offer the option of amniocentesis given maternal age, anxiety, and the AFP tetra results.    Targeted ultrasound uses high frequency sound waves to create an image of the developing fetus.  An ultrasound is often recommended as a routine means of evaluating the pregnancy.  It is also used to screen for fetal anatomy problems (for example, a heart  defect) that might be suggestive of a chromosomal or other abnormality.   Amniocentesis involves the removal of a small amount of amniotic fluid from the sac surrounding the fetus with the use of a thin needle inserted through the maternal abdomen and uterus.  Ultrasound guidance is used throughout the procedure.  Fetal cells from amniotic fluid are directly evaluated and > 99.5% of chromosome problems and > 98% of open neural tube defects can be detected. This procedure is generally performed after the 15th week of pregnancy.  The main risks to this procedure include complications leading to miscarriage in less than 1 in 200 cases (0.5%).  CF carrier screening is already pending per EPIC.  However, we did offer the option of Hemoglobinopathy evaluation, as prior testing was only for hemoglobin solubility, which would cannot detect any variations other than S trait.  Asian patients would be at higher risk for other hemoglobin variants as well.  Lastly, per ACOG guidelines we also offered carrier screening for SMA, which the patient desired.  We obtained a detailed family history and pregnancy history.  The patient reported that her brother has a 83 year old son with polydactyly.  He has no other developmental or physical differences to suggest a genetic syndrome.  We reviewed that polydactyly can occur as an isolated trait or as part of numerous genetic conditions.  In the absence of a known genetic syndrome, the recurrence risk would be expected to be low in a third degree relative. The remainder of the family history is unremarkable for birth defects, mental retardation, recurrent pregnancy loss or known chromosome abnormalities.   Ms. Leicht reported no complications or exposures in the pregnancy that would be expected to increase the risk for birth defects.  After consideration of the options, the patient elected to proceed with an ultrasound only and to decline amniocentesis today, though she may call back  if she changes her mind.   An ultrasound was performed at the time of the visit.  The gestational age was consistent with 81 weeks.  No markers of aneuploidy were seen, however, it is important to remember that ultrasound can not diagnose chromosome abnormalities or all birth defects.    The patient was encouraged to call with questions or concerns.  We can be contacted at [phone].  Labs ordered:  SMA carrier screening, hemoglobinopathy evaluation  Wilburt Finlay, MS, CGC  I met with the patient and agree with the summary provided by the genetic counselor   Gatha Mayer, MD

## 2018-03-31 LAB — HEMOGLOBINOPATHY EVALUATION
Hgb A2 Quant: 2.6 % (ref 1.8–3.2)
Hgb A: 97.4 % (ref 96.4–98.8)
Hgb C: 0 %
Hgb F Quant: 0 % (ref 0.0–2.0)
Hgb S Quant: 0 %
Hgb Variant: 0 %

## 2018-04-07 LAB — SMN1 COPY NUMBER ANALYSIS (SMA CARRIER SCREENING)

## 2018-04-10 ENCOUNTER — Telehealth: Payer: Self-pay | Admitting: Obstetrics and Gynecology

## 2018-04-10 ENCOUNTER — Other Ambulatory Visit: Payer: Self-pay

## 2018-04-10 DIAGNOSIS — O09522 Supervision of elderly multigravida, second trimester: Secondary | ICD-10-CM

## 2018-04-10 NOTE — Telephone Encounter (Signed)
Deanna Hutchinson was informed of the results for the carrier screening for SMA and hemoglobinopathies.   The hemoglobinopathy evaluation showed normal AA hemoglobin.  This result combined with a normal MCV (86) indicates that Deanna Hutchinson is not a carrier for any clinically significant hemoglobinopathy.  The results of the SMA carrier screening are also available.  SMA is also a recessive genetic condition with variable age of onset and severity caused by mutations in the SMN1 gene.  This carrier testing assesses the number of copies of this gene.  Persons with one copy of the SMN1 gene are carriers, and those with no copies are affected with the condition.  Individuals with two or more copies have a reduced chance to be a carrier.  Not all mutations can be detected with this testing, though it can detect 93.3% of carriers in the Asian population.  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  Again, this testing cannot eliminate the chance to have a child with SMA, but dramatically reduces the chance.    We encouraged the patient to call with any questions or concerns as they arise.  We may be reached at [phone].  Cherly Andersoneborah F. Masoud Nyce, MS, CGC

## 2018-04-12 ENCOUNTER — Encounter: Payer: BLUE CROSS/BLUE SHIELD | Admitting: Obstetrics and Gynecology

## 2018-04-13 ENCOUNTER — Ambulatory Visit
Admission: RE | Admit: 2018-04-13 | Discharge: 2018-04-13 | Disposition: A | Discharge status: Home / Self Care | Payer: BLUE CROSS/BLUE SHIELD | Source: Ambulatory Visit | Attending: Obstetrics and Gynecology | Admitting: Obstetrics and Gynecology

## 2018-04-13 DIAGNOSIS — Z3689 Encounter for other specified antenatal screening: Secondary | ICD-10-CM | POA: Insufficient documentation

## 2018-04-13 DIAGNOSIS — Z3A19 19 weeks gestation of pregnancy: Secondary | ICD-10-CM | POA: Insufficient documentation

## 2018-04-13 DIAGNOSIS — O09522 Supervision of elderly multigravida, second trimester: Secondary | ICD-10-CM | POA: Diagnosis not present

## 2018-04-25 ENCOUNTER — Ambulatory Visit: Payer: BLUE CROSS/BLUE SHIELD

## 2018-04-25 ENCOUNTER — Ambulatory Visit (INDEPENDENT_AMBULATORY_CARE_PROVIDER_SITE_OTHER): Payer: BLUE CROSS/BLUE SHIELD | Admitting: Certified Nurse Midwife

## 2018-04-25 VITALS — BP 99/66 | HR 87 | Wt 155.4 lb

## 2018-04-25 DIAGNOSIS — Z3492 Encounter for supervision of normal pregnancy, unspecified, second trimester: Secondary | ICD-10-CM | POA: Diagnosis not present

## 2018-04-25 LAB — POCT URINALYSIS DIPSTICK OB
Bilirubin, UA: NEGATIVE
Blood, UA: NEGATIVE
Glucose, UA: NEGATIVE
Ketones, UA: NEGATIVE
LEUKOCYTES UA: NEGATIVE
Nitrite, UA: NEGATIVE
PH UA: 6 (ref 5.0–8.0)
POC,PROTEIN,UA: NEGATIVE
Spec Grav, UA: <=1.005 — AB (ref 1.010–1.025)
UROBILINOGEN UA: 0.2 U/dL

## 2018-04-25 NOTE — Patient Instructions (Signed)
WHAT OB PATIENTS CAN EXPECT   Confirmation of pregnancy and ultrasound ordered if medically indicated-[redacted] weeks gestation  New OB (NOB) intake with nurse and New OB (NOB) labs- [redacted] weeks gestation  New OB (NOB) physical examination with provider- 11/[redacted] weeks gestation  Flu vaccine-[redacted] weeks gestation  Anatomy scan-[redacted] weeks gestation  Glucose tolerance test, blood work to test for anemia, T-dap vaccine-[redacted] weeks gestation  Vaginal swabs/cultures-STD/Group B strep-[redacted] weeks gestation  Appointments every 4 weeks until 28 weeks  Every 2 weeks from 28 weeks until 36 weeks  Weekly visits from 36 weeks until delivery  Round Ligament Pain The round ligament is a cord of muscle and tissue that helps to support the uterus. It can become a source of pain during pregnancy if it becomes stretched or twisted as the baby grows. The pain usually begins in the second trimester of pregnancy, and it can come and go until the baby is delivered. It is not a serious problem, and it does not cause harm to the baby. Round ligament pain is usually a short, sharp, and pinching pain, but it can also be a dull, lingering, and aching pain. The pain is felt in the lower side of the abdomen or in the groin. It usually starts deep in the groin and moves up to the outside of the hip area. Pain can occur with:  A sudden change in position.  Rolling over in bed.  Coughing or sneezing.  Physical activity.  Follow these instructions at home: Watch your condition for any changes. Take these steps to help with your pain:  When the pain starts, relax. Then try: ? Sitting down. ? Flexing your knees up to your abdomen. ? Lying on your side with one pillow under your abdomen and another pillow between your legs. ? Sitting in a warm bath for 15-20 minutes or until the pain goes away.  Take over-the-counter and prescription medicines only as told by your health care provider.  Move slowly when you sit and stand.  Avoid  long walks if they cause pain.  Stop or lessen your physical activities if they cause pain.  Contact a health care provider if:  Your pain does not go away with treatment.  You feel pain in your back that you did not have before.  Your medicine is not helping. Get help right away if:  You develop a fever or chills.  You develop uterine contractions.  You develop vaginal bleeding.  You develop nausea or vomiting.  You develop diarrhea.  You have pain when you urinate. This information is not intended to replace advice given to you by your health care provider. Make sure you discuss any questions you have with your health care provider. Document Released: 02/03/2008 Document Revised: 10/02/2015 Document Reviewed: 07/03/2014 Elsevier Interactive Patient Education  2018 [employer] Back Pain in Pregnancy Back pain during pregnancy is common. Back pain may be caused by several factors that are related to changes during your pregnancy. Follow these instructions at home: Managing pain, stiffness, and swelling  If directed, apply ice for sudden (acute) back pain. ? Put ice in a plastic bag. ? Place a towel between your skin and the bag. ? Leave the ice on for 20 minutes, 2-3 times per day.  If directed, apply heat to the affected area before you exercise: ? Place a towel between your skin and the heat pack or heating pad. ? Leave the heat on for 20-30 minutes. ? Remove the heat if your   skin turns bright red. This is especially important if you are unable to feel pain, heat, or cold. You may have a greater risk of getting burned. Activity  Exercise as told by your health care provider. Exercising is the best way to prevent or manage back pain.  Listen to your body when lifting. If lifting hurts, ask for help or bend your knees. This uses your leg muscles instead of your back muscles.  Squat down when picking up something from the floor. Do not bend over.  Only use bed rest  as told by your health care provider. Bed rest should only be used for the most severe episodes of back pain. Standing, Sitting, and Lying Down  Do not stand in one place for long periods of time.  Use good posture when sitting. Make sure your head rests over your shoulders and is not hanging forward. Use a pillow on your lower back if necessary.  Try sleeping on your side, preferably the left side, with a pillow or two between your legs. If you are sore after a night's rest, your bed may be too soft. A firm mattress may provide more support for your back during pregnancy. General instructions  Do not wear high heels.  Eat a healthy diet. Try to gain weight within your health care provider's recommendations.  Use a maternity girdle, elastic sling, or back brace as told by your health care provider.  Take over-the-counter and prescription medicines only as told by your health care provider.  Keep all follow-up visits as told by your health care provider. This is important. This includes any visits with any specialists, such as a physical therapist. Contact a health care provider if:  Your back pain interferes with your daily activities.  You have increasing pain in other parts of your body. Get help right away if:  You develop numbness, tingling, weakness, or problems with the use of your arms or legs.  You develop severe back pain that is not controlled with medicine.  You have a sudden change in bowel or bladder control.  You develop shortness of breath, dizziness, or you faint.  You develop nausea, vomiting, or sweating.  You have back pain that is a rhythmic, cramping pain similar to labor pains. Labor pain is usually 1-2 minutes apart, lasts for about 1 minute, and involves a bearing down feeling or pressure in your pelvis.  You have back pain and your water breaks or you have vaginal bleeding.  You have back pain or numbness that travels down your leg.  Your back pain  developed after you fell.  You develop pain on one side of your back.  You see blood in your urine.  You develop skin blisters in the area of your back pain. This information is not intended to replace advice given to you by your health care provider. Make sure you discuss any questions you have with your health care provider. Document Released: 08/04/2005 Document Revised: 10/02/2015 Document Reviewed: 01/08/2015 Elsevier Interactive Patient Education  2018 [employer] Inc[employer]  

## 2018-04-25 NOTE — Progress Notes (Signed)
[name], no complaints.  

## 2018-04-26 ENCOUNTER — Ambulatory Visit (INDEPENDENT_AMBULATORY_CARE_PROVIDER_SITE_OTHER): Payer: BLUE CROSS/BLUE SHIELD | Admitting: Obstetrics and Gynecology

## 2018-04-26 ENCOUNTER — Encounter: Payer: Self-pay | Admitting: Obstetrics and Gynecology

## 2018-04-26 VITALS — BP 111/58 | HR 83 | Wt 154.8 lb

## 2018-04-26 DIAGNOSIS — R87610 Atypical squamous cells of undetermined significance on cytologic smear of cervix (ASC-US): Secondary | ICD-10-CM

## 2018-04-26 DIAGNOSIS — R87810 Cervical high risk human papillomavirus (HPV) DNA test positive: Secondary | ICD-10-CM

## 2018-04-26 NOTE — Progress Notes (Signed)
ENCOMPASS COLPOSCOPY PROCEDURE NOTE  37 y.o. [id number] here for colposcopy for ASCUS with POSITIVE high risk HPV pap smear on 02/22/18. Discussed role for HPV in cervical dysplasia, need for surveillance.  Patient given informed consent, signed copy in the chart, time out was performed.  Placed in lithotomy position. Cervix viewed with speculum and colposcope after application of acetic acid.   Colposcopy adequate? Yes  acetowhite lesion(s) noted at 11 & 7 o'clock; no biopsies obtained due to pregnancy. See scanned colpo sheet with detailed drawing.   wetprep + yeast and TNTC WBC- will send for NuSwab to confirm.     Melody Suzan NailerN Shambley, CNM

## 2018-04-26 NOTE — Addendum Note (Signed)
Addended by: Rosine BeatLONTZ, Tareq Dwan L on: 04/26/2018 01:56 PM   Modules accepted: Orders

## 2018-04-27 LAB — NUSWAB VAGINITIS PLUS (VG+)

## 2018-04-27 NOTE — Progress Notes (Signed)
[name]-Doing well. MFM on 04/13/2018-ultrasound wnl, declines amniocentesis. No questions or concerns at time time. Anticipatory guidance regarding course of prenatal care. Reviewed red flag symptoms and when to call. RTC x 4 weeks for [name] or sooner if needed.

## 2018-05-10 NOTE — L&D Delivery Note (Addendum)
      Delivery Note   Deanna Hutchinson is a 38 y.o. G3P3003 at [redacted]w[redacted]d Estimated Date of Delivery: 09/06/18  PRE-OPERATIVE DIAGNOSIS:  1) [redacted]w[redacted]d pregnancy.   POST-OPERATIVE DIAGNOSIS:  1) [redacted]w[redacted]d pregnancy s/p Vaginal, Spontaneous   Delivery Type: Vaginal, Spontaneous    Delivery Anesthesia: Epidural   Labor Complications:   None    ESTIMATED BLOOD LOSS: 300  ml    FINDINGS:   1) female infant, Apgar scores of 9    at 1 minute and 9    at 5 minutes and a birthweight of 144.27  ounces.    2) Nuchal cord : None  SPECIMENS:   PLACENTA:   Appearance: Intact , 3 vessel cord   Removal: Spontaneous      Disposition:    held per protocol then discarded  DISPOSITION:  Infant to left in stable condition in the delivery room, with L&D personnel and mother,  NARRATIVE SUMMARY: Labor course:  [name] is a [id number] at [redacted]w[redacted]d who presented for induction of labor.  She progressed well in labor with pitocin.  She received the appropriate anesthesia epidural and proceeded to complete dilation. She evidenced good maternal expulsive effort during the second stage. Head delivered OA and rotated to LOT. McRoberts maneuver to deliver the anterior shoulder.  She went on to deliver a viable female infant " Deanna Hutchinson". The placenta delivered without problems and was noted to be complete. A perineal and vaginal examination was performed. Episiotomy/Lacerations:   [employer], hemostatic no need for repair. The patient tolerated this well.  Doreene Burke, CNM  08/31/2018 5:35 PM

## 2018-05-17 ENCOUNTER — Other Ambulatory Visit: Payer: Self-pay

## 2018-05-17 ENCOUNTER — Telehealth: Payer: Self-pay | Admitting: Certified Nurse Midwife

## 2018-05-17 MED ORDER — PRENATAL MULTIVITAMIN CH: 1.0000 | ORAL_TABLET | Freq: Every day | ORAL | 6 refills | Status: DC

## 2018-05-17 MED ORDER — PRENATAL MULTIVITAMIN CH: 1.0000 | ORAL_TABLET | Freq: Every day | ORAL | 11 refills | Status: DC

## 2018-05-17 NOTE — Telephone Encounter (Signed)
Th patient called and stated that she would like to speak with her nurse in regards to her prescription if possible today, Please advise.

## 2018-05-17 NOTE — Telephone Encounter (Signed)
Spoke with patient, she request a prescription be sent to pharmacy on file for prenatal vitamin.  Patient aware prescription will be sent in.  Patient verbalized understanding.

## 2018-05-23 ENCOUNTER — Ambulatory Visit (INDEPENDENT_AMBULATORY_CARE_PROVIDER_SITE_OTHER): Payer: BLUE CROSS/BLUE SHIELD | Admitting: Certified Nurse Midwife

## 2018-05-23 ENCOUNTER — Other Ambulatory Visit: Payer: Self-pay

## 2018-05-23 VITALS — BP 97/59 | HR 94 | Wt 158.1 lb

## 2018-05-23 DIAGNOSIS — Z3492 Encounter for supervision of normal pregnancy, unspecified, second trimester: Secondary | ICD-10-CM | POA: Diagnosis not present

## 2018-05-23 LAB — POCT URINALYSIS DIPSTICK OB
BILIRUBIN UA: NEGATIVE
Blood, UA: NEGATIVE
Glucose, UA: NEGATIVE
KETONES UA: NEGATIVE
Nitrite, UA: NEGATIVE
PH UA: 7 (ref 5.0–8.0)
POC,PROTEIN,UA: NEGATIVE
Spec Grav, UA: 1.02 (ref 1.010–1.025)
UROBILINOGEN UA: 0.2 U/dL

## 2018-05-23 MED ORDER — PRENATAL MULTIVITAMIN CH: 1.0000 | ORAL_TABLET | Freq: Every day | ORAL | 6 refills | Status: DC

## 2018-05-23 MED ORDER — HYDROCORTISONE 2.5 % RE CREA: 1.0000 "application " | TOPICAL_CREAM | Freq: Two times a day (BID) | RECTAL | 0 refills | Status: DC

## 2018-05-23 NOTE — Progress Notes (Signed)
[name] complains of hemorrhoids she has been using preporation H with little relief . Anusol cream ordered. Has indigestion has not tried any meds from med list I.e. tums, zantac. Reviewed 28 wk labs next visit. She verbalizes understanding and agrees . Follow up 3 wks .   Doreene Burke, CNM

## 2018-06-12 ENCOUNTER — Ambulatory Visit (INDEPENDENT_AMBULATORY_CARE_PROVIDER_SITE_OTHER): Payer: BLUE CROSS/BLUE SHIELD | Admitting: Certified Nurse Midwife

## 2018-06-12 ENCOUNTER — Encounter: Payer: Self-pay | Admitting: Certified Nurse Midwife

## 2018-06-12 ENCOUNTER — Other Ambulatory Visit: Payer: BLUE CROSS/BLUE SHIELD

## 2018-06-12 VITALS — BP 106/64 | HR 85 | Wt 160.6 lb

## 2018-06-12 DIAGNOSIS — Z3492 Encounter for supervision of normal pregnancy, unspecified, second trimester: Secondary | ICD-10-CM | POA: Diagnosis not present

## 2018-06-12 DIAGNOSIS — Z131 Encounter for screening for diabetes mellitus: Secondary | ICD-10-CM

## 2018-06-12 LAB — POCT URINALYSIS DIPSTICK OB
BILIRUBIN UA: NEGATIVE
GLUCOSE, UA: NEGATIVE
Ketones, UA: NEGATIVE
LEUKOCYTES UA: NEGATIVE
Nitrite, UA: NEGATIVE
POC,PROTEIN,UA: NEGATIVE
RBC UA: NEGATIVE
Spec Grav, UA: 1.01 (ref 1.010–1.025)
Urobilinogen, UA: 0.2 E.U./dL
pH, UA: 6.5 (ref 5.0–8.0)

## 2018-06-12 MED ORDER — TETANUS-DIPHTH-ACELL PERTUSSIS 5-2.5-18.5 LF-MCG/0.5 IM SUSP
0.5000 mL | Freq: Once | INTRAMUSCULAR | Status: AC
  Administered 2018-06-12: 0.5 mL via INTRAMUSCULAR

## 2018-06-12 NOTE — Patient Instructions (Signed)
Glucose Tolerance Test During Pregnancy Why am I having this test? The glucose tolerance test (GTT) is done to check how your body processes sugar (glucose). This is one of several tests used to diagnose diabetes that develops during pregnancy (gestational diabetes mellitus). Gestational diabetes is a temporary form of diabetes that some women develop during pregnancy. It usually occurs during the second trimester of pregnancy and goes away after delivery. Testing (screening) for gestational diabetes usually occurs between 24 and 28 weeks of pregnancy. You may have the GTT test after having a 1-hour glucose screening test if the results from that test indicate that you may have gestational diabetes. You may also have this test if:  You have a history of gestational diabetes.  You have a history of giving birth to very large babies or have experienced repeated fetal loss (stillbirth).  You have signs and symptoms of diabetes, such as: ? Changes in your vision. ? Tingling or numbness in your hands or feet. ? Changes in hunger, thirst, and urination that are not otherwise explained by your pregnancy. What is being tested? This test measures the amount of glucose in your blood at different times during a period of 3 hours. This indicates how well your body is able to process glucose. What kind of sample is taken?  Blood samples are required for this test. They are usually collected by inserting a needle into a blood vessel. How do I prepare for this test?  For 3 days before your test, eat normally. Have plenty of carbohydrate-rich foods.  Follow instructions from your health care provider about: ? Eating or drinking restrictions on the day of the test. You may be asked to not eat or drink anything other than water (fast) starting 8-10 hours before the test. ? Changing or stopping your regular medicines. Some medicines may interfere with this test. Tell a health care provider about:  All  medicines you are taking, including vitamins, herbs, eye drops, creams, and over-the-counter medicines.  Any blood disorders you have.  Any surgeries you have had.  Any medical conditions you have. What happens during the test? First, your blood glucose will be measured. This is referred to as your fasting blood glucose, since you fasted before the test. Then, you will drink a glucose solution that contains a certain amount of glucose. Your blood glucose will be measured again 1, 2, and 3 hours after drinking the solution. This test takes about 3 hours to complete. You will need to stay at the testing location during this time. During the testing period:  Do not eat or drink anything other than the glucose solution.  Do not exercise.  Do not use any products that contain nicotine or tobacco, such as cigarettes and e-cigarettes. If you need help stopping, ask your health care provider. The testing procedure may vary among health care providers and hospitals. How are the results reported? Your results will be reported as milligrams of glucose per deciliter of blood (mg/dL) or millimoles per liter (mmol/L). Your health care provider will compare your results to normal ranges that were established after testing a large group of people (reference ranges). Reference ranges may vary among labs and hospitals. For this test, common reference ranges are:  Fasting: less than 95-105 mg/dL (5.3-5.8 mmol/L).  1 hour after drinking glucose: less than 180-190 mg/dL (10.0-10.5 mmol/L).  2 hours after drinking glucose: less than 155-165 mg/dL (8.6-9.2 mmol/L).  3 hours after drinking glucose: 140-145 mg/dL (7.8-8.1 mmol/L). What do the   results mean? Results within reference ranges are considered normal, meaning that your glucose levels are well-controlled. If two or more of your blood glucose levels are high, you may be diagnosed with gestational diabetes. If only one level is high, your health care  provider may suggest repeat testing or other tests to confirm a diagnosis. Talk with your health care provider about what your results mean. Questions to ask your health care provider Ask your health care provider, or the department that is doing the test:  When will my results be ready?  How will I get my results?  What are my treatment options?  What other tests do I need?  What are my next steps? Summary  The glucose tolerance test (GTT) is one of several tests used to diagnose diabetes that develops during pregnancy (gestational diabetes mellitus). Gestational diabetes is a temporary form of diabetes that some women develop during pregnancy.  You may have the GTT test after having a 1-hour glucose screening test if the results from that test indicate that you may have gestational diabetes. You may also have this test if you have any symptoms or risk factors for gestational diabetes.  Talk with your health care provider about what your results mean. This information is not intended to replace advice given to you by your health care provider. Make sure you discuss any questions you have with your health care provider. Document Released: 10/26/2011 Document Revised: 12/06/2016 Document Reviewed: 12/06/2016 Elsevier Interactive Patient Education  2019 Elsevier Inc.  

## 2018-06-12 NOTE — Progress Notes (Signed)
[name] doing well. No complaints. Reviewed [facility] classes, doula program. TDAP/BTC/RPR/Glucose/CBC today. Discussed birth control after baby. She is considering tubal or IUD. She is unsure of baby name at this time. Will follow up with labs [name] 2 wks.   Doreene Burke, CNM

## 2018-06-13 ENCOUNTER — Other Ambulatory Visit: Payer: Self-pay | Admitting: Certified Nurse Midwife

## 2018-06-13 ENCOUNTER — Telehealth: Payer: Self-pay

## 2018-06-13 DIAGNOSIS — O99810 Abnormal glucose complicating pregnancy: Secondary | ICD-10-CM

## 2018-06-13 LAB — CBC
HEMATOCRIT: 28.1 % — AB (ref 34.0–46.6)
HEMOGLOBIN: 9.3 g/dL — AB (ref 11.1–15.9)
MCH: 28.5 pg (ref 26.6–33.0)
MCHC: 33.1 g/dL (ref 31.5–35.7)
MCV: 86 fL (ref 79–97)
Platelets: 219 10*3/uL (ref 150–450)
RBC: 3.26 x10E6/uL — ABNORMAL LOW (ref 3.77–5.28)
RDW: 11.9 % (ref 11.7–15.4)
WBC: 7.3 10*3/uL (ref 3.4–10.8)

## 2018-06-13 LAB — GLUCOSE, 1 HOUR GESTATIONAL: Gestational Diabetes Screen: 178 mg/dL — ABNORMAL HIGH (ref 65–139)

## 2018-06-13 LAB — SYPHILIS: RPR W/REFLEX TO RPR TITER AND TREPONEMAL ANTIBODIES, TRADITIONAL SCREENING AND DIAGNOSIS ALGORITHM: RPR Ser Ql: NONREACTIVE

## 2018-06-13 MED ORDER — FUSION PLUS PO CAPS: 1.0000 | ORAL_CAPSULE | Freq: Every day | ORAL | 11 refills | Status: DC

## 2018-06-13 NOTE — Telephone Encounter (Signed)
Informed pt of ATs orders. Explained the need for a 3 hour GTT x 2. Explained to pt she needs to be NPO after 12 midnight with only sips of water allowed. Also informed pt of low iron and the need to start iron supplement which was sent to pts pharmacy on file. Lab appt made for 06/14/18 per pt request.

## 2018-06-13 NOTE — Progress Notes (Signed)
CBC shows anemia. Fusion plus ordered . Elevated glucose screen 3 hr ordered. Pt called with results and instructions for 3 hr test.

## 2018-06-14 ENCOUNTER — Other Ambulatory Visit: Payer: BLUE CROSS/BLUE SHIELD

## 2018-06-14 DIAGNOSIS — O99810 Abnormal glucose complicating pregnancy: Secondary | ICD-10-CM

## 2018-06-15 LAB — GESTATIONAL GLUCOSE TOLERANCE
GLUCOSE 1 HOUR GTT: 153 mg/dL (ref 65–179)
GLUCOSE 2 HOUR GTT: 143 mg/dL (ref 65–154)
GLUCOSE FASTING: 77 mg/dL (ref 65–94)
Glucose, GTT - 3 Hour: 78 mg/dL (ref 65–139)

## 2018-06-16 ENCOUNTER — Telehealth: Payer: Self-pay

## 2018-06-16 NOTE — Telephone Encounter (Signed)
Patient informed of negative 3 hour glucose test per AT.

## 2018-06-27 ENCOUNTER — Ambulatory Visit (INDEPENDENT_AMBULATORY_CARE_PROVIDER_SITE_OTHER): Payer: BLUE CROSS/BLUE SHIELD | Admitting: Obstetrics and Gynecology

## 2018-06-27 VITALS — BP 130/61 | HR 101 | Wt 162.9 lb

## 2018-06-27 DIAGNOSIS — Z3403 Encounter for supervision of normal first pregnancy, third trimester: Secondary | ICD-10-CM

## 2018-06-27 NOTE — Patient Instructions (Signed)
Iron Deficiency Anemia, Adult  Iron-deficiency anemia is when you have a low amount of red blood cells or hemoglobin. This happens because you have too little iron in your body. Hemoglobin carries oxygen to parts of the body. Anemia can cause your body to not get enough oxygen. It may or may not cause symptoms.  Follow these instructions at home:  Medicines  · Take over-the-counter and prescription medicines only as told by your doctor. This includes iron pills (supplements) and vitamins.  · If you cannot handle taking iron pills by mouth, ask your doctor about getting iron through:  ? A vein (intravenously).  ? A shot (injection) into a muscle.  · Take iron pills when your stomach is empty. If you cannot handle this, take them with food.  · Do not drink milk or take antacids at the same time as your iron pills.  · To prevent trouble pooping (constipation), eat fiber or take medicine (stool softener) as told by your doctor.  Eating and drinking    · Talk with your doctor before changing the foods you eat. He or she may tell you to eat foods that have a lot of iron, such as:  ? Liver.  ? Lowfat (lean) beef.  ? Breads and cereals that have iron added to them (fortified breads and cereals).  ? Eggs.  ? Dried fruit.  ? Dark green, leafy vegetables.  · Drink enough fluid to keep your pee (urine) clear or pale yellow.  · Eat fresh fruits and vegetables that are high in vitamin C. They help your body to use iron. Foods with a lot of vitamin C include:  ? Oranges.  ? Peppers.  ? Tomatoes.  ? Mangoes.  General instructions  · Return to your normal activities as told by your doctor. Ask your doctor what activities are safe for you.  · Keep yourself clean, and keep things clean around you (your surroundings). Anemia can make you get sick more easily.  · Keep all follow-up visits as told by your doctor. This is important.  Contact a doctor if:  · You feel sick to your stomach (nauseous).  · You throw up (vomit).  · You feel  weak.  · You are sweating for no clear reason.  · You have trouble pooping, such as:  ? Pooping (having a bowel movement) less than 3 times a week.  ? Straining to poop.  ? Having poop that is hard, dry, or larger than normal.  ? Feeling full or bloated.  ? Pain in the lower belly.  ? Not feeling better after pooping.  Get help right away if:  · You pass out (faint). If this happens, do not drive yourself to the hospital. Call your local emergency services (911 in the [place]  · You have chest pain.  · You have shortness of breath that:  ? Is very bad.  ? Gets worse with physical activity.  · You have a fast heartbeat.  · You get light-headed when getting up from sitting or lying down.  This information is not intended to replace advice given to you by your health care provider. Make sure you discuss any questions you have with your health care provider.  Document Released: 05/29/2010 Document Revised: 01/14/2016 Document Reviewed: 01/14/2016  Elsevier Interactive Patient Education © 2019 [employer] Inc.

## 2018-06-27 NOTE — Progress Notes (Signed)
[name]- doing well, tolerating iron supplement well. Discussed breast feeding, and mirena for [facility].

## 2018-06-27 NOTE — Progress Notes (Signed)
[name]. Pt states that everything is normal and has no concerns today.

## 2018-07-13 ENCOUNTER — Ambulatory Visit (INDEPENDENT_AMBULATORY_CARE_PROVIDER_SITE_OTHER): Payer: BLUE CROSS/BLUE SHIELD | Admitting: Certified Nurse Midwife

## 2018-07-13 VITALS — BP 102/56 | HR 85 | Wt 162.1 lb

## 2018-07-13 DIAGNOSIS — Z3493 Encounter for supervision of normal pregnancy, unspecified, third trimester: Secondary | ICD-10-CM

## 2018-07-13 LAB — POCT URINALYSIS DIPSTICK OB
BILIRUBIN UA: NEGATIVE
GLUCOSE, UA: NEGATIVE
KETONES UA: NEGATIVE
Leukocytes, UA: NEGATIVE
Nitrite, UA: NEGATIVE
PROTEIN: NEGATIVE
RBC UA: NEGATIVE
Spec Grav, UA: 1.01 (ref 1.010–1.025)
Urobilinogen, UA: 0.2 E.U./dL
pH, UA: 7 (ref 5.0–8.0)

## 2018-07-13 NOTE — Progress Notes (Signed)
[name]-Reports round ligament pain and insomnia. Discussed home treatment measures including use of abdominal support and OTC medications and teas; handouts given. Notes history of large baby after postdates pregnancy. Will order growth ultrasound around 36-37 weeks. Anticipatory guidance regarding course of prenatal care. Reviewed red flag symptoms and when to call. RTC x 2 week for repeat labs and [name] or sooner if needed.

## 2018-07-13 NOTE — Patient Instructions (Addendum)
WE WOULD LOVE TO HEAR FROM YOU!!!!   Thank you Bary Richard for visiting [facility].  Providing our patients with the best experience possible is really important to [place], and we hope that you felt that on your recent visit. The most valuable feedback we get comes from [place]!!    If you receive a survey please take a couple of minutes to let us know how we did.Thank you for continuing to trust [place] with your care.   [facility]   Common Medications Safe in Pregnancy  Acne:      Constipation:  Benzoyl Peroxide     Colace  Clindamycin      Dulcolax Suppository  Topica Erythromycin     Fibercon  Salicylic Acid      Metamucil         Miralax AVOID:        Senakot   Accutane    Cough:  Retin-A       Cough Drops  Tetracycline      Phenergan w/ Codeine if Rx  Minocycline      Robitussin (Plain & DM)  Antibiotics:     Crabs/Lice:  Ceclor       RID  Cephalosporins    AVOID:  E-Mycins      Kwell  Keflex  Macrobid/Macrodantin   Diarrhea:  Penicillin      Kao-Pectate  Zithromax      Imodium AD         PUSH FLUIDS AVOID:       Cipro     Fever:  Tetracycline      Tylenol (Regular or Extra  Minocycline       Strength)  Levaquin      Extra Strength-Do not          Exceed 8 tabs/24 hrs Caffeine:        <215m/day (equiv. To 1 cup of coffee or  approx. 3 12 oz sodas)         Gas: Cold/Hayfever:       Gas-X  Benadryl      Mylicon  Claritin       Phazyme  **Claritin-D        Chlor-Trimeton    Headaches:  Dimetapp      ASA-Free Excedrin  Drixoral-Non-Drowsy     Cold Compress  Mucinex (Guaifenasin)     Tylenol (Regular or Extra  Sudafed/Sudafed-12 Hour     Strength)  **Sudafed PE Pseudoephedrine   Tylenol Cold & Sinus     Vicks Vapor Rub  Zyrtec  **AVOID if Problems With Blood Pressure         Heartburn: Avoid lying down for at least 1 hour after meals  Aciphex      Maalox     Rash:  Milk of Magnesia     Benadryl    Mylanta       1%  Hydrocortisone Cream  Pepcid  Pepcid Complete   Sleep Aids:  Prevacid      Ambien   Prilosec       Benadryl  Rolaids       Chamomile Tea  Tums (Limit 4/day)     Unisom  Zantac       Tylenol PM         Warm milk-add vanilla or  Hemorrhoids:       Sugar for taste  Anusol/Anusol H.C.  (RX: Analapram 2.5%)  Sugar Substitutes:  Hydrocortisone OTC     Ok in moderation  Preparation H      Tucks        Vaseline lotion applied to tissue with wiping    Herpes:     Throat:  Acyclovir      Oragel  Famvir  Valtrex     Vaccines:         Flu Shot Leg Cramps:       *Gardasil  Benadryl      Hepatitis A         Hepatitis B Nasal Spray:       Pneumovax  Saline Nasal Spray     Polio Booster         Tetanus Nausea:       Tuberculosis test or PPD  Vitamin B6 25 mg TID   AVOID:    Dramamine      *Gardasil  Emetrol       Live Poliovirus  Ginger Root 250 mg QID    MMR (measles, mumps &  High Complex Carbs @ Bedtime    rebella)  Sea Bands-Accupressure    Varicella (Chickenpox)  Unisom 1/2 tab TID     *No known complications           If received before Pain:         Known pregnancy;   Darvocet       Resume series after  Lortab        Delivery  Percocet    Yeast:   Tramadol      Femstat  Tylenol 3      Gyne-lotrimin  Ultram       Monistat  Vicodin           MISC:         All Sunscreens           Hair Coloring/highlights          Insect Repellant's          (Including DEET)         Mystic Tans   Vaginal Delivery  Vaginal delivery means that you give birth by pushing your baby out of your birth canal (vagina). A team of health care providers will help you before, during, and after vaginal delivery. Birth experiences are unique for every woman and every pregnancy, and birth experiences vary depending on where you choose to give birth. What happens when I arrive at the birth center or hospital? Once you are in labor and have been admitted into the hospital or birth center, your health care  provider may:  Review your pregnancy history and any concerns that you have.  Insert an IV into one of your veins. This may be used to give you fluids and medicines.  Check your blood pressure, pulse, temperature, and heart rate (vital signs).  Check whether your bag of water (amniotic sac) has broken (ruptured).  Talk with you about your birth plan and discuss pain control options. Monitoring Your health care provider may monitor your contractions (uterine monitoring) and your baby's heart rate (fetal monitoring). You may need to be monitored:  Often, but not continuously (intermittently).  All the time or for long periods at a time (continuously). Continuous monitoring may be needed if: ? You are taking certain medicines, such as medicine to relieve pain or make your contractions stronger. ? You have pregnancy or labor complications. Monitoring may be done by:  Placing a special stethoscope or a handheld monitoring device on your abdomen to check your baby's heartbeat and to check for  contractions.  Placing monitors on your abdomen (external monitors) to record your baby's heartbeat and the frequency and length of contractions.  Placing monitors inside your uterus through your vagina (internal monitors) to record your baby's heartbeat and the frequency, length, and strength of your contractions. Depending on the type of monitor, it may remain in your uterus or on your baby's head until birth.  Telemetry. This is a type of continuous monitoring that can be done with external or internal monitors. Instead of having to stay in bed, you are able to move around during telemetry. Physical exam Your health care provider may perform frequent physical exams. This may include:  Checking how and where your baby is positioned in your uterus.  Checking your cervix to determine: ? Whether it is thinning out (effacing). ? Whether it is opening up (dilating). What happens during labor and  delivery?  Normal labor and delivery is divided into the following three stages: Stage 1  This is the longest stage of labor.  This stage can last for hours or days.  Throughout this stage, you will feel contractions. Contractions generally feel mild, infrequent, and irregular at first. They get stronger, more frequent (about every 2-3 minutes), and more regular as you move through this stage.  This stage ends when your cervix is completely dilated to 4 inches (10 cm) and completely effaced. Stage 2  This stage starts once your cervix is completely effaced and dilated and lasts until the delivery of your baby.  This stage may last from 20 minutes to 2 hours.  This is the stage where you will feel an urge to push your baby out of your vagina.  You may feel stretching and burning pain, especially when the widest part of your baby's head passes through the vaginal opening (crowning).  Once your baby is delivered, the umbilical cord will be clamped and cut. This usually occurs after waiting a period of 1-2 minutes after delivery.  Your baby will be placed on your bare chest (skin-to-skin contact) in an upright position and covered with a warm blanket. Watch your baby for feeding cues, like rooting or sucking, and help the baby to your breast for his or her first feeding. Stage 3  This stage starts immediately after the birth of your baby and ends after you deliver the placenta.  This stage may take anywhere from 5 to 30 minutes.  After your baby has been delivered, you will feel contractions as your body expels the placenta and your uterus contracts to control bleeding. What can I expect after labor and delivery?  After labor is over, you and your baby will be monitored closely until you are ready to go home to ensure that you are both healthy. Your health care team will teach you how to care for yourself and your baby.  You and your baby will stay in the same room (rooming in) during  your hospital stay. This will encourage early bonding and successful breastfeeding.  You may continue to receive fluids and medicines through an IV.  Your uterus will be checked and massaged regularly (fundal massage).  You will have some soreness and pain in your abdomen, vagina, and the area of skin between your vaginal opening and your anus (perineum).  If an incision was made near your vagina (episiotomy) or if you had some vaginal tearing during delivery, cold compresses may be placed on your episiotomy or your tear. This helps to reduce pain and swelling.  You may  be given a squirt bottle to use instead of wiping when you go to the bathroom. To use the squirt bottle, follow these steps: ? Before you urinate, fill the squirt bottle with warm water. Do not use hot water. ? After you urinate, while you are sitting on the toilet, use the squirt bottle to rinse the area around your urethra and vaginal opening. This rinses away any urine and blood. ? Fill the squirt bottle with clean water every time you use the bathroom.  It is normal to have vaginal bleeding after delivery. Wear a sanitary pad for vaginal bleeding and discharge. Summary  Vaginal delivery means that you will give birth by pushing your baby out of your birth canal (vagina).  Your health care provider may monitor your contractions (uterine monitoring) and your baby's heart rate (fetal monitoring).  Your health care provider may perform a physical exam.  Normal labor and delivery is divided into three stages.  After labor is over, you and your baby will be monitored closely until you are ready to go home. This information is not intended to replace advice given to you by your health care provider. Make sure you discuss any questions you have with your health care provider. Document Released: 02/03/2008 Document Revised: 05/31/2017 Document Reviewed: 05/31/2017 Elsevier Interactive Patient Education  2019 [employer].

## 2018-07-24 ENCOUNTER — Other Ambulatory Visit: Payer: Self-pay

## 2018-07-24 ENCOUNTER — Ambulatory Visit (INDEPENDENT_AMBULATORY_CARE_PROVIDER_SITE_OTHER): Payer: BLUE CROSS/BLUE SHIELD | Admitting: Certified Nurse Midwife

## 2018-07-24 ENCOUNTER — Encounter: Payer: Self-pay | Admitting: Certified Nurse Midwife

## 2018-07-24 VITALS — BP 92/61 | HR 90 | Wt 164.2 lb

## 2018-07-24 DIAGNOSIS — Z3493 Encounter for supervision of normal pregnancy, unspecified, third trimester: Secondary | ICD-10-CM

## 2018-07-24 LAB — POCT URINALYSIS DIPSTICK OB
Bilirubin, UA: NEGATIVE
Blood, UA: NEGATIVE
Glucose, UA: NEGATIVE
KETONES UA: NEGATIVE
Leukocytes, UA: NEGATIVE
Nitrite, UA: NEGATIVE
PH UA: 8 (ref 5.0–8.0)
POC,PROTEIN,UA: NEGATIVE
UROBILINOGEN UA: 0.2 U/dL

## 2018-07-24 NOTE — Progress Notes (Signed)
[name] doing well. Feels good movement. She has no concerns. State she thinks babies name is "Adeline" not 100% yet but that is what she is leaning towards. Discussed GBS and cultures and next visit. She verbalizes and agrees to plan. Follow up 2 wks.   Doreene Burke, CNM

## 2018-07-24 NOTE — Patient Instructions (Signed)
Td Vaccine (Tetanus and Diphtheria): What You Need to Know 1. Why get vaccinated? Tetanus  and diphtheria are very serious diseases. They are rare in the [place] today, but people who do become infected often have severe complications. Td vaccine is used to protect adolescents and adults from both of these diseases. Both tetanus and diphtheria are infections caused by bacteria. Diphtheria spreads from person to person through coughing or sneezing. Tetanus-causing bacteria enter the body through cuts, scratches, or wounds. TETANUS (Lockjaw) causes painful muscle tightening and stiffness, usually all over the body.  It can lead to tightening of muscles in the head and neck so you can't open your mouth, swallow, or sometimes even breathe. Tetanus kills about 1 out of every 10 people who are infected even after receiving the best medical care. DIPHTHERIA can cause a thick coating to form in the back of the throat.  It can lead to breathing problems, paralysis, heart failure, and death. Before vaccines, as many as 200,000 cases of diphtheria and hundreds of cases of tetanus were reported in the [place] each year. Since vaccination began, reports of cases for both diseases have dropped by about 99%. 2. Td vaccine Td vaccine can protect adolescents and adults from tetanus and diphtheria. Td is usually given as a booster dose every 10 years but it can also be given earlier after a severe and dirty wound or burn. Another vaccine, called Tdap, which protects against pertussis in addition to tetanus and diphtheria, is sometimes recommended instead of Td vaccine. Your doctor or the person giving you the vaccine can give you more information. Td may safely be given at the same time as other vaccines. 3. Some people should not get this vaccine  A person who has ever had a life-threatening allergic reaction after a previous dose of any tetanus or diphtheria containing vaccine, OR has a severe allergy  to any part of this vaccine, should not get Td vaccine. Tell the person giving the vaccine about any severe allergies.  Talk to your doctor if you: ? had severe pain or swelling after any vaccine containing diphtheria or tetanus, ? ever had a condition called Guillain Barr Syndrome (GBS), ? aren't feeling well on the day the shot is scheduled. 4. Risks of a vaccine reaction With any medicine, including vaccines, there is a chance of side effects. These are usually mild and go away on their own. Serious reactions are also possible but are rare. Most people who get Td vaccine do not have any problems with it. Mild Problems following Td vaccine: (Did not interfere with activities)  Pain where the shot was given (about 8 people in 10)  Redness or swelling where the shot was given (about 1 person in 4)  Mild fever (rare)  Headache (about 1 person in 4)  Tiredness (about 1 person in 4) Moderate Problems following Td vaccine: (Interfered with activities, but did not require medical attention)  Fever over 102F (rare) Severe Problems following Td vaccine: (Unable to perform usual activities; required medical attention)  Swelling, severe pain, bleeding and/or redness in the arm where the shot was given (rare). Problems that could happen after any vaccine:  People sometimes faint after a medical procedure, including vaccination. Sitting or lying down for about 15 minutes can help prevent fainting, and injuries caused by a fall. Tell your doctor if you feel dizzy, or have vision changes or ringing in the ears.  Some people get severe pain in the shoulder and have   difficulty moving the arm where a shot was given. This happens very rarely.  Any medication can cause a severe allergic reaction. Such reactions from a vaccine are very rare, estimated at fewer than 1 in a million doses, and would happen within a few minutes to a few hours after the vaccination. As with any medicine, there is a  very remote chance of a vaccine causing a serious injury or death. The safety of vaccines is always being monitored. For more information, visit: http://floyd.org/ 5. What if there is a serious reaction? What should I look for?  Look for anything that concerns you, such as signs of a severe allergic reaction, very high fever, or unusual behavior. Signs of a severe allergic reaction can include hives, swelling of the face and throat, difficulty breathing, a fast heartbeat, dizziness, and weakness. These would usually start a few minutes to a few hours after the vaccination. What should I do?  If you think it is a severe allergic reaction or other emergency that can't wait, call 9-1-1 or get the person to the nearest hospital. Otherwise, call your doctor.  Afterward, the reaction should be reported to the Vaccine Adverse Event Reporting System (VAERS). Your doctor might file this report, or you can do it yourself through the VAERS web site at www.vaers.LAgents.no, or by calling 1-5153152666. VAERS does not give medical advice. 6. The National Vaccine Injury Compensation Program The [employer] Vaccine Injury Compensation Program (VICP) is a federal program that was created to compensate people who may have been injured by certain vaccines. Persons who believe they may have been injured by a vaccine can learn about the program and about filing a claim by calling 1-281-164-9778 or visiting the VICP website at SpiritualWord.at. There is a time limit to file a claim for compensation. 7. How can I learn more?  Ask your doctor. He or she can give you the vaccine package insert or suggest other sources of information.  Call your local or state health department.  Contact the Centers for Disease Control and Prevention (CDC): ? Call (616) 459-5433 (1-800-CDC-INFO) ? Visit CDC's website at PicCapture.uy Vaccine Information Statement Td Vaccine (08/19/15) This information is  not intended to replace advice given to you by your health care provider. Make sure you discuss any questions you have with your health care provider. Document Released: 02/21/2006 Document Revised: 12/12/2017 Document Reviewed: 12/12/2017 Elsevier Interactive Patient Education  2019 Else[employer] Inc. Group B Streptococcus Infection During Pregnancy  Group B Streptococcus (GBS) is a type of bacteria (Streptococcus agalactiae) that is often found in healthy people, commonly in the rectum, vagina, and intestines. In people who are healthy and not pregnant, the bacteria rarely cause serious illness or complications. However, women who test positive for GBS during pregnancy can pass the bacteria to their baby during childbirth, which can cause serious infection in the baby after birth. Women with GBS may also have infections during their pregnancy or immediately after childbirth, such as such as urinary tract infections (UTIs) or infections of the uterus (uterine infections). Having GBS also increases a woman's risk of complications during pregnancy, such as early (preterm) labor or delivery, miscarriage, or stillbirth. Routine testing (screening) for GBS is recommended for all pregnant women. What increases the risk? You may have a higher risk for GBS infection during pregnancy if you had one during a past pregnancy. What are the signs or symptoms? In most cases, GBS infection does not cause symptoms in pregnant women. Signs and symptoms of a possible  GBS-related infection may include:  Labor starting before the 37th week of pregnancy.  A UTI or bladder infection, which may cause: ? Fever. ? Pain or burning during urination. ? Frequent urination.  Fever during labor, along with: ? Bad-smelling discharge. ? Uterine tenderness. ? Rapid heartbeat in the mother, baby, or both. Rare but serious symptoms of a possible GBS-related infection in women include:  Blood infection (septicemia). This may cause  fever, chills, or confusion.  Lung infection (pneumonia). This may cause fever, chills, cough, rapid breathing, difficulty breathing, or chest pain.  Bone, joint, skin, or soft tissue infection. How is this diagnosed? You may be screened for GBS between week 35 and week 37 of your pregnancy. If you have symptoms of preterm labor, you may be screened earlier. This condition is diagnosed based on lab test results from:  A swab of fluid from the vagina and rectum.  A urine sample. How is this treated? This condition is treated with antibiotic medicine. When you go into labor, or as soon as your water breaks (your membranes rupture), you will be given antibiotics through an IV tube. Antibiotics will continue until after you give birth. If you are having a cesarean delivery, you do not need antibiotics unless your membranes have already ruptured. Follow these instructions at home:  Take over-the-counter and prescription medicines only as told by your health care provider.  Take your antibiotic medicine as told by your health care provider. Do not stop taking the antibiotic even if you start to feel better.  Keep all pre-birth (prenatal) visits and follow-up visits as told by your health care provider. This is important. Contact a health care provider if:  You have pain or burning when you urinate.  You have to urinate frequently.  You have a fever or chills.  You develop a bad-smelling vaginal discharge. Get help right away if:  Your membranes rupture.  You go into labor.  You have severe pain in your abdomen.  You have difficulty breathing.  You have chest pain. This information is not intended to replace advice given to you by your health care provider. Make sure you discuss any questions you have with your health care provider. Document Released: 08/03/2007 Document Revised: 11/21/2015 Document Reviewed: 11/20/2015 Elsevier Interactive Patient Education  [employer].

## 2018-08-04 ENCOUNTER — Telehealth: Payer: Self-pay

## 2018-08-04 NOTE — Progress Notes (Signed)
Coronavirus (COVID-19) Are you at risk?  Are you at risk for the Coronavirus (COVID-19)?  To be considered HIGH RISK for Coronavirus (COVID-19), you have to meet the following criteria:  . Traveled to [place], [place], [place], [place] or [place]; or in the [place] to [place], [place], [place], or [place]; and have fever, cough, and shortness of breath within the last 2 weeks of travel OR . Been in close contact with a person diagnosed with COVID-19 within the last 2 weeks and have fever, cough, and shortness of breath . IF YOU DO NOT MEET THESE CRITERIA, YOU ARE CONSIDERED LOW RISK FOR COVID-19.  What to do if you are HIGH RISK for COVID-19?  Marland Kitchen If you are having a medical emergency, call 911. . Seek medical care right away. Before you go to a doctor's office, urgent care or emergency department, call ahead and tell them about your recent travel, contact with someone diagnosed with COVID-19, and your symptoms. You should receive instructions from your physician's office regarding next steps of care.  . When you arrive at healthcare provider, tell the healthcare staff immediately you have returned from visiting [place], [place], [place], [place] or [place]; or traveled in the [place] to [place], [place], [place], or [place]; in the last two weeks or you have been in close contact with a person diagnosed with COVID-19 in the last 2 weeks.   . Tell the health care staff about your symptoms: fever, cough and shortness of breath. . After you have been seen by a medical provider, you will be either: o Tested for (COVID-19) and discharged home on quarantine except to seek medical care if symptoms worsen, and asked to  - Stay home and avoid contact with others until you get your results (4-5 days)  - Avoid travel on public transportation if possible (such as bus, train, or airplane) or o Sent to the Emergency Department by EMS for evaluation, COVID-19 testing, and possible  admission depending on your condition and test results.  What to do if you are LOW RISK for COVID-19?  Reduce your risk of any infection by using the same precautions used for avoiding the common cold or flu:  Marland Kitchen Wash your hands often with soap and warm water for at least 20 seconds.  If soap and water are not readily available, use an alcohol-based hand sanitizer with at least 60% alcohol.  . If coughing or sneezing, cover your mouth and nose by coughing or sneezing into the elbow areas of your shirt or coat, into a tissue or into your sleeve (not your hands). . Avoid shaking hands with others and consider head nods or verbal greetings only. . Avoid touching your eyes, nose, or mouth with unwashed hands.  . Avoid close contact with people who are Yeiden Frenkel. . Avoid places or events with large numbers of people in one location, like concerts or sporting events. . Carefully consider travel plans you have or are making. . If you are planning any travel outside or inside the [place], visit the CDC's Travelers' Health webpage for the latest health notices. . If you have some symptoms but not all symptoms, continue to monitor at home and seek medical attention if your symptoms worsen. . If you are having a medical emergency, call 911.  08/04/18 Screening negative sls ADDITIONAL HEALTHCARE OPTIONS FOR PATIENTS  [facility] / e-Visit: [email or web address]         [facility]: [phone]  [facility]: [phone]                   [facility]: [phone]  

## 2018-08-04 NOTE — Telephone Encounter (Signed)
Coronavirus (COVID-19) Are you at risk?  Are you at risk for the Coronavirus (COVID-19)?  To be considered HIGH RISK for Coronavirus (COVID-19), you have to meet the following criteria:  . Traveled to [place], [place], [place], [place] or [place]; or in the [place] to [place], [place], [place], or [place]; and have fever, cough, and shortness of breath within the last 2 weeks of travel OR . Been in close contact with a person diagnosed with COVID-19 within the last 2 weeks and have fever, cough, and shortness of breath . IF YOU DO NOT MEET THESE CRITERIA, YOU ARE CONSIDERED LOW RISK FOR COVID-19.  What to do if you are HIGH RISK for COVID-19?  . If you are having a medical emergency, call 911. . Seek medical care right away. Before you go to a doctor's office, urgent care or emergency department, call ahead and tell them about your recent travel, contact with someone diagnosed with COVID-19, and your symptoms. You should receive instructions from your physician's office regarding next steps of care.  . When you arrive at healthcare provider, tell the healthcare staff immediately you have returned from visiting [place], [place], [place], [place] or [place]; or traveled in the [place] to [place], [place], [place], or [place]; in the last two weeks or you have been in close contact with a person diagnosed with COVID-19 in the last 2 weeks.   . Tell the health care staff about your symptoms: fever, cough and shortness of breath. . After you have been seen by a medical provider, you will be either: o Tested for (COVID-19) and discharged home on quarantine except to seek medical care if symptoms worsen, and asked to  - Stay home and avoid contact with others until you get your results (4-5 days)  - Avoid travel on public transportation if possible (such as bus, train, or airplane) or o Sent to the Emergency Department by EMS for evaluation, COVID-19 testing, and possible  admission depending on your condition and test results.  What to do if you are LOW RISK for COVID-19?  Reduce your risk of any infection by using the same precautions used for avoiding the common cold or flu:  . Wash your hands often with soap and warm water for at least 20 seconds.  If soap and water are not readily available, use an alcohol-based hand sanitizer with at least 60% alcohol.  . If coughing or sneezing, cover your mouth and nose by coughing or sneezing into the elbow areas of your shirt or coat, into a tissue or into your sleeve (not your hands). . Avoid shaking hands with others and consider head nods or verbal greetings only. . Avoid touching your eyes, nose, or mouth with unwashed hands.  . Avoid close contact with people who are sick. . Avoid places or events with large numbers of people in one location, like concerts or sporting events. . Carefully consider travel plans you have or are making. . If you are planning any travel outside or inside the [place], visit the CDC's Travelers' Health webpage for the latest health notices. . If you have some symptoms but not all symptoms, continue to monitor at home and seek medical attention if your symptoms worsen. . If you are having a medical emergency, call 911.   ADDITIONAL HEALTHCARE OPTIONS FOR PATIENTS  [facility] / e-Visit: [email or web address]         [facility]: [phone]     Urgent Care: [phone]                   [facility]: [phone]  Prescreened covid 19/flu- neg. cm 

## 2018-08-07 ENCOUNTER — Other Ambulatory Visit: Payer: Self-pay

## 2018-08-07 ENCOUNTER — Ambulatory Visit (INDEPENDENT_AMBULATORY_CARE_PROVIDER_SITE_OTHER): Payer: BLUE CROSS/BLUE SHIELD | Admitting: Certified Nurse Midwife

## 2018-08-07 VITALS — BP 106/59 | HR 84 | Wt 161.5 lb

## 2018-08-07 DIAGNOSIS — Z3493 Encounter for supervision of normal pregnancy, unspecified, third trimester: Secondary | ICD-10-CM

## 2018-08-07 LAB — POCT URINALYSIS DIPSTICK OB
Bilirubin, UA: NEGATIVE
Blood, UA: NEGATIVE
Glucose, UA: NEGATIVE
Ketones, UA: NEGATIVE
LEUKOCYTES UA: NEGATIVE
Nitrite, UA: NEGATIVE
PH UA: 5 (ref 5.0–8.0)
PROTEIN: NEGATIVE
Spec Grav, UA: 1.01 (ref 1.010–1.025)
Urobilinogen, UA: 0.2 E.U./dL

## 2018-08-07 NOTE — Patient Instructions (Addendum)
Vaginal Delivery  Vaginal delivery means that you give birth by pushing your baby out of your birth canal (vagina). A team of health care providers will help you before, during, and after vaginal delivery. Birth experiences are unique for every woman and every pregnancy, and birth experiences vary depending on where you choose to give birth. What happens when I arrive at the birth center or hospital? Once you are in labor and have been admitted into the hospital or birth center, your health care provider may:  Review your pregnancy history and any concerns that you have.  Insert an IV into one of your veins. This may be used to give you fluids and medicines.  Check your blood pressure, pulse, temperature, and heart rate (vital signs).  Check whether your bag of water (amniotic sac) has broken (ruptured).  Talk with you about your birth plan and discuss pain control options. Monitoring Your health care provider may monitor your contractions (uterine monitoring) and your baby's heart rate (fetal monitoring). You may need to be monitored:  Often, but not continuously (intermittently).  All the time or for long periods at a time (continuously). Continuous monitoring may be needed if: ? You are taking certain medicines, such as medicine to relieve pain or make your contractions stronger. ? You have pregnancy or labor complications. Monitoring may be done by:  Placing a special stethoscope or a handheld monitoring device on your abdomen to check your baby's heartbeat and to check for contractions.  Placing monitors on your abdomen (external monitors) to record your baby's heartbeat and the frequency and length of contractions.  Placing monitors inside your uterus through your vagina (internal monitors) to record your baby's heartbeat and the frequency, length, and strength of your contractions. Depending on the type of monitor, it may remain in your uterus or on your baby's head until  birth.  Telemetry. This is a type of continuous monitoring that can be done with external or internal monitors. Instead of having to stay in bed, you are able to move around during telemetry. Physical exam Your health care provider may perform frequent physical exams. This may include:  Checking how and where your baby is positioned in your uterus.  Checking your cervix to determine: ? Whether it is thinning out (effacing). ? Whether it is opening up (dilating). What happens during labor and delivery?  Normal labor and delivery is divided into the following three stages: Stage 1  This is the longest stage of labor.  This stage can last for hours or days.  Throughout this stage, you will feel contractions. Contractions generally feel mild, infrequent, and irregular at first. They get stronger, more frequent (about every 2-3 minutes), and more regular as you move through this stage.  This stage ends when your cervix is completely dilated to 4 inches (10 cm) and completely effaced. Stage 2  This stage starts once your cervix is completely effaced and dilated and lasts until the delivery of your baby.  This stage may last from 20 minutes to 2 hours.  This is the stage where you will feel an urge to push your baby out of your vagina.  You may feel stretching and burning pain, especially when the widest part of your baby's head passes through the vaginal opening (crowning).  Once your baby is delivered, the umbilical cord will be clamped and cut. This usually occurs after waiting a period of 1-2 minutes after delivery.  Your baby will be placed on your bare chest (  skin-to-skin contact) in an upright position and covered with a warm blanket. Watch your baby for feeding cues, like rooting or sucking, and help the baby to your breast for his or her first feeding. Stage 3  This stage starts immediately after the birth of your baby and ends after you deliver the placenta.  This stage may  take anywhere from 5 to 30 minutes.  After your baby has been delivered, you will feel contractions as your body expels the placenta and your uterus contracts to control bleeding. What can I expect after labor and delivery?  After labor is over, you and your baby will be monitored closely until you are ready to go home to ensure that you are both healthy. Your health care team will teach you how to care for yourself and your baby.  You and your baby will stay in the same room (rooming in) during your hospital stay. This will encourage early bonding and successful breastfeeding.  You may continue to receive fluids and medicines through an IV.  Your uterus will be checked and massaged regularly (fundal massage).  You will have some soreness and pain in your abdomen, vagina, and the area of skin between your vaginal opening and your anus (perineum).  If an incision was made near your vagina (episiotomy) or if you had some vaginal tearing during delivery, cold compresses may be placed on your episiotomy or your tear. This helps to reduce pain and swelling.  You may be given a squirt bottle to use instead of wiping when you go to the bathroom. To use the squirt bottle, follow these steps: ? Before you urinate, fill the squirt bottle with warm water. Do not use hot water. ? After you urinate, while you are sitting on the toilet, use the squirt bottle to rinse the area around your urethra and vaginal opening. This rinses away any urine and blood. ? Fill the squirt bottle with clean water every time you use the bathroom.  It is normal to have vaginal bleeding after delivery. Wear a sanitary pad for vaginal bleeding and discharge. Summary  Vaginal delivery means that you will give birth by pushing your baby out of your birth canal (vagina).  Your health care provider may monitor your contractions (uterine monitoring) and your baby's heart rate (fetal monitoring).  Your health care provider may  perform a physical exam.  Normal labor and delivery is divided into three stages.  After labor is over, you and your baby will be monitored closely until you are ready to go home. This information is not intended to replace advice given to you by your health care provider. Make sure you discuss any questions you have with your health care provider. Document Released: 02/03/2008 Document Revised: 05/31/2017 Document Reviewed: 05/31/2017 Elsevier Interactive Patient Education  2019 Else[employer] Inc[employer]  Fetal Movement Counts Patient Name: ________________________________________________ Patient Due Date: ____________________ What is a fetal movement count?  A fetal movement count is the number of times that you feel your baby move during a certain amount of time. This may also be called a fetal kick count. A fetal movement count is recommended for every pregnant woman. You may be asked to start counting fetal movements as early as week 28 of your pregnancy. Pay attention to when your baby is most active. You may notice your baby's sleep and wake cycles. You may also notice things that make your baby move more. You should do a fetal movement count:  When your baby is  normally most active.  At the same time each day. A good time to count movements is while you are resting, after having something to eat and drink. How do I count fetal movements? 1. Find a quiet, comfortable area. Sit, or lie down on your side. 2. Write down the date, the start time and stop time, and the number of movements that you felt between those two times. Take this information with you to your health care visits. 3. For 2 hours, count kicks, flutters, swishes, rolls, and jabs. You should feel at least 10 movements during 2 hours. 4. You may stop counting after you have felt 10 movements. 5. If you do not feel 10 movements in 2 hours, have something to eat and drink. Then, keep resting and counting for 1 hour. If you feel at  least 4 movements during that hour, you may stop counting. Contact a health care provider if:  You feel fewer than 4 movements in 2 hours.  Your baby is not moving like he or she usually does. Date: ____________ Start time: ____________ Stop time: ____________ Movements: ____________ Date: ____________ Start time: ____________ Stop time: ____________ Movements: ____________ Date: ____________ Start time: ____________ Stop time: ____________ Movements: ____________ Date: ____________ Start time: ____________ Stop time: ____________ Movements: ____________ Date: ____________ Start time: ____________ Stop time: ____________ Movements: ____________ Date: ____________ Start time: ____________ Stop time: ____________ Movements: ____________ Date: ____________ Start time: ____________ Stop time: ____________ Movements: ____________ Date: ____________ Start time: ____________ Stop time: ____________ Movements: ____________ Date: ____________ Start time: ____________ Stop time: ____________ Movements: ____________ This information is not intended to replace advice given to you by your health care provider. Make sure you discuss any questions you have with your health care provider. Document Released: 05/26/2006 Document Revised: 12/24/2015 Document Reviewed: 06/05/2015 Elsevier Interactive Patient Education  2019 Elsevier Inc.                                                                                                                    FREQUENTLY ASKED QUESTIONS FOR OBSTETRICS/PEDIATRICS    Q: Why are visitor restrictions different for maternity care areas?  [place] is restricting visitors for the duration of the patient's hospitalization. The birth of a child involves the mother, considered the patient, and a birthing partner. These are unprecedented times and we are making the exception to allow a birthing partner to be a part of the patient unit. No other guests will be allowed in our  [facility] at [facility] and at [facility].   Q: Are credentialed doulas allowed to support their existing patients?  We acknowledge the value these doula partnerships offer our care teams and many birthing families in our communities. Each laboring mother is allowed one birthing partner of the patient's choosing for her entire hospitalization.   Q: Are visitor restrictions different for hospitalized children?  Pediatric patients (infants and children under 14 years of age), such as those in the Children's Unit, Pediatric ICU and NICU,  will be allowed two visitors (parents or legal guardians)   Q: Are pregnant women at an increased risk for COVID-19?  The [employer] of Obstetricians and Gynecologists (ACOG) is monitoring closely the coronavirus pandemic. With the limited information available, data does not indicate pregnant women are at an increased risk. However, pregnant women are known to be at greater risk for respiratory infections like flu. With that in mind, expectant mothers are considered an at-risk population for COVID-19, according to ACOG.   Q: Are newborns at an increased risk for COVID-19?  A limited sample of COVID-19 data with newborns indicates the virus is not transferred to the infant during pregnancy. However, postpartum separation is recommended by the Centers for Disease Control (CDC). As a result [facility] recommends and strongly encourages temporary separation of moms and babies who test positive for COVID-19 or are awaiting results to rule out COVID-19 based on CDC guidelines.   Q: If you have a suspected case of COVID-19, is the NICU couplet care room an option?  No. If either patient is considered at-risk for having COVID-19, the [facility] at [facility] will not use the NICU couplet care rooms for that family.   Q: [place] is urging that elective procedures be postponed. What is considered elective  for women's and children's service line?  NOT ELECTIVE: Obstetric procedures, even those with an element of choice on timing, are not considered elective. Circumcisions are considered elective procedures, however, these do not deplete blood products and other resources, which is the spirit in which the COVID-19 postponement of elective procedures was intended. Therefore, circumcisions will be allowed.   ELECTIVE: Postpartum tubal ligations are considered elective and should be postponed. Q&A for Obstetricians, Gynecologists and Pediatricians  Published July 28, 2018   [facility] supports as much as possible the medical care team working with the patient's individual needs to address timing during these unprecedented times. We seek the support of our medical care team in preserving needed resources throughout our crisis response to COVID-19.   Q: How does COVID-19 impact breastfeeding?  Breastmilk is safe for your baby - even if the mother has tested positive for COVID-19. If a COVID-19+ mother decides to breastfeed while inpatient and after discharge, we suggest proper protective equipment be worn and hand hygiene be performed before and after feeding the infant. The new mother also has the option to pump her milk and have a healthy family member feed the baby to protect the baby from getting the virus.   Q: Should we urge patients to avoid baby showers and large gatherings?  Yes. As has been recommended for all citizens in our communities, gatherings of 10 or more should be avoided - pregnant or not. Seek creative options for "hosting" baby showers through electronic means that honor the request for social distancing during this time of heightened awareness.   Q: Should patients miss their prenatal appointments?  No. Prenatal visits are NOT elective. While we want to limit contact and exposure, prenatal care is vital right now. Contact your physician's office if you have concerns about your visits.  We are limiting outpatient office visits to the patient and one guest in order to reduce the potential for exposure.   Q: What if a pregnant woman feels sick? Should she miss her prenatal visit then?  A pregnant woman experiencing coronavirus-like symptoms (i.e., cough, fever, difficulty breathing, shortness of breath, gastrointestinal issues) should contact her pregnancy care provider by phone.  Her medical professional can best determine whether she should use a video visit or possibly go to a collection site to be tested for COVID-19. Contacting her primary care provider or her pregnancy care provider is her first step.   Q: What can I do about childbirth education? All the classes are cancelled.  The [facility] will offer online learning to support mothers on their journey. We currently offer Understanding Childbirth, Understanding Breastfeeding and Understanding Newborn Care as an online class. Please visit our website, [email or web address], to register for an online class.   Q: How can I keep from getting COVID-19? Q&A for Obstetricians, Gynecologists and Pediatricians  Published July 28, 2018   Together, we can reduce the risk of exposure to the virus and help you and your family remain healthy and safe. One of the best ways to protect yourself is to wash your hands frequently using soap and water. Also, you should avoid touching your eyes, nose and mouth with unwashed hands, avoid physical contact with others and practice social distancing.   Q: How are employees being informed about what to do?  [place] leaders receive a daily COVID-19 update and share relevant information with their teams. This is a time when health care professionals are called on to lead within our community. We appreciate our staff's engagement with our COVID-19 updates and encourage them to share best practices on reducing the spread of the virus with our patients and community. We are  prepared to provide the exceptional COVID-19 care and coordination our community needs, expects and deserves.   Q: Who's in charge of this issue at [facility]?  The leadership structure and process established to address COVID-19 includes Chief Physician Executive Birdie Sons, MD; Infection Prevention Medical Director Judyann Munson, MD; and Infection Prevention Interim Director Jason Fila, MSN, RN, CIC, CSPDT. A team of  experts reflecting a broad spectrum of our workforce is meeting daily to evaluate new information we receive about COVID-19 and to adapt policies and practices accordingly.                         Published July 28, 2018    Skin Conditions During Pregnancy Pregnancy affects many parts of the human body. One part is the skin. Most skin problems that develop during pregnancy are not serious and are considered a normal part of pregnancy. Many skin problems go away on their own after the baby is born. What type of skin problems can develop during pregnancy? Common skin conditions  Stretch marks. Stretch marks are purple or pink lines on the skin. They may appear on the belly, breasts, thighs, or buttocks. Stretch marks are caused by weight gain, which causes the skin to stretch. Stretch marks do not cause problems. Almost all women get them during pregnancy. Most stretch marks fade after pregnancy, but may not disappear completely.  Darkening of the skin (hyperpigmentation). The darkening may occur in patches or as a line. Patches may appear on the face (melasma), nipples, or genital area. A dark line may also form and stretch from the belly button to the pubic area (linea nigra). Hyperpigmentation develops in almost all pregnant women. It is more severe in women with a dark complexion.  Spider angiomas or spider veins. These are tiny pink or red lines that go out from a center point, like the legs of a spider. Usually, they are on the face, neck, and arms. They do not  cause problems. They are most common in women with light complexions. Spider veins usually fade after the baby is born.  Varicose veins. Excess blood volume during pregnancy can cause veins to enlarge. They can look like swollen veins above the surface of the skin and are usually red or purple. They are usually found on the legs and buttocks (hemorrhoids). In most cases, the varicose veins go away after pregnancy.  Pruritic urticarial papules and plaques of pregnancy (PUPPP rash). This is an itchy, red rash that has tiny blisters. The cause is unknown. It generally starts on the abdomen and may affect the arms or legs. It usually begins later in pregnancy. The rash is not known to affect the fetus. Sometimes, oral steroids are used to soothe the itch. The rash clears after the baby is born. Uncommon skin conditions  Pemphigoid gestationis. This is a very rare autoimmune disease. It causes a severely itchy rash and blisters. The rash usually appears on the abdomen, buttocks, arms, and legs. It usually goes away within 3 months after delivery. It may return (recur) with subsequent pregnancies.  Pruritic folliculitis of pregnancy. This is a rare condition that causes pimple-like skin growths. It develops in the middle or later stages of pregnancy. The cause of this condition is not known. It usually goes away 2-3 weeks after delivery, but can recur with subsequent pregnancies.  Intrahepatic cholestasis of pregnancy. This is a rare liver condition that causes itchy skin, but not a rash. It usually occurs on the palms of the hands and soles of the feet, but may spread to the abdomen. It usually starts in the third trimester and can increase the risk of complications for the fetus. This condition usually heals after delivery, but it can recur in future pregnancies. It may run in families (inherited).  Impetigo herpetiformis. This is a form of a severe skin disease, also called pustular psoriasis. It causes  many translucent, white bumps that may form a crust when they burst. It usually occurs in the third trimester. The condition usually heals after delivery, but can recur in future pregnancies.  Palmar erythema. This is a reddening of the palms. It is most common in women with light complexions. It usually fades away after pregnancy.  Prurigo of pregnancy. This is a disease in which itchy red patches and bumps appear on the body. This condition can happen any time during pregnancy. Usually, it starts as a few bumps but increases each day. The cause is unknown. The patches and bumps clear after the baby is born. Other skin conditions  Swelling and redness. This can occur on the face, eyelids, fingers, or toes.  Acne. Pimples may develop, including in women who have had clear skin for a long time.  Skin tags. These are small flaps of skin that stick out from the body. They may grow or become darker during pregnancy. They are usually harmless. They do not go away on their own, but can be removed by a health care provider.  Moles. These are flat or slightly raised growths. They are usually round and pink or brown. They may grow or become darker during pregnancy. Some skin problems that were there before pregnancy (pre-existing skin conditions), such as atopic dermatitis or psoriasis, may become worse during pregnancy. Follow these instructions at home: Different conditions may have different instructions for care. In general:  Follow all your health care provider's directions about medicines to treat skin problems while you are pregnant. Do not use over-the-counter medicines,  creams, or lotions until you have checked with your health care provider. Many medicines are not safe to use when you are pregnant.  Limit time in the sun. This will help keep your skin from darkening. When you must be outside, use sunscreen and wear a hat with a wide brim to protect your face. The sunscreen should have a SPF of  at least 15.  Use a gentle soap. This helps prevent skin irritation.  Do not get too hot or too sweaty. This makes some skin rashes worse.  Wear loose clothes made of a soft fabric. This prevents skin irritation.  Use a skin moisturizer. Ask your health care provider for suggestions.  Exercise regularly, if possible. Ask your health care provider about the activities that are safe for you. Healthy pregnant women should aim for 2 hours and 30 minutes of moderate exercise per week. This can help reduce several pregnancy side effects, including varicose veins.  Keep all follow-up visits as told by your health care provider. This is important. Summary  Most skin problems that develop during pregnancy are not serious and are considered a normal part of pregnancy. They usually go away on their own after the baby is born.  Some common skin problems include stretch marks, darkening of the skin, spider veins, varicose veins, and PUPPP rash.  Follow all your health care provider's directions about medicines to treat skin problems while you are pregnant. Do not use any over-the-counter medicines, creams, or lotions until you have checked with your health care provider.  To prevent and manage skin problems, use gentle soap, try not to get too hot or sweaty, wear comfortable, loose-fitting clothing, and limit time in the sun.  Some pre-existing skin conditions, such as atopic dermatitis or psoriasis, may become worse during pregnancy. This information is not intended to replace advice given to you by your health care provider. Make sure you discuss any questions you have with your health care provider. Document Released: 05/29/2010 Document Revised: 08/03/2016 Document Reviewed: 08/03/2016 Elsevier Interactive Patient Education  2019 [employer].

## 2018-08-07 NOTE — Progress Notes (Signed)
[name]-Request PNV samples. Education regarding COVID restrictions and precautions. 36 week cultures collected. Herbal prep handout given. Anticipatory guidance regarding course of prenatal care. Encouraged twice daily kick counts. Reviewed red flag symptoms and when to call. RTC x 2 weeks for [name] or sooner if needed.

## 2018-08-09 ENCOUNTER — Encounter: Payer: Self-pay | Admitting: Certified Nurse Midwife

## 2018-08-09 DIAGNOSIS — B951 Streptococcus, group B, as the cause of diseases classified elsewhere: Secondary | ICD-10-CM | POA: Insufficient documentation

## 2018-08-09 LAB — GC/CHLAMYDIA PROBE AMP
Chlamydia trachomatis, NAA: NEGATIVE
Neisseria Gonorrhoeae by PCR: NEGATIVE

## 2018-08-09 LAB — STREP GP B NAA: STREP GROUP B AG: POSITIVE — AB

## 2018-08-10 ENCOUNTER — Telehealth: Payer: Self-pay

## 2018-08-10 NOTE — Telephone Encounter (Signed)
Informed patient of positive GBS results- husband got on phone and it was explained to him also. Husband states "well that explains it she has been coughing her head off". Redirected to the area of collection- vaginal-to which he replied "oh so she will get antibiotics while she is in labor?" Again treatment was explained and understood. Encouraged to let us know if there are any further questions or concerns.

## 2018-08-23 ENCOUNTER — Other Ambulatory Visit: Payer: Self-pay

## 2018-08-23 ENCOUNTER — Ambulatory Visit (INDEPENDENT_AMBULATORY_CARE_PROVIDER_SITE_OTHER): Payer: BLUE CROSS/BLUE SHIELD | Admitting: Certified Nurse Midwife

## 2018-08-23 ENCOUNTER — Encounter: Payer: Self-pay | Admitting: Certified Nurse Midwife

## 2018-08-23 VITALS — BP 108/70 | HR 88 | Wt 165.4 lb

## 2018-08-23 DIAGNOSIS — Z3493 Encounter for supervision of normal pregnancy, unspecified, third trimester: Secondary | ICD-10-CM

## 2018-08-23 NOTE — Progress Notes (Signed)
[name] doing well. Feels good movement. Having irregular contractions. Discussed induction due to [facility]. She is in agreement to plan. Will scheduled for 39 wks. SVE 2-3/50/-3 . Labor precautions reviewed. Follow up as scheduled at [facility] 08/31/18 @ 0600.   Doreene Burke, CNM

## 2018-08-23 NOTE — Progress Notes (Signed)
[name]- pt is having some pelvic pressure 

## 2018-08-23 NOTE — Patient Instructions (Signed)
Braxton Hicks Contractions Contractions of the uterus can occur throughout pregnancy, but they are not always a sign that you are in labor. You may have practice contractions called Braxton Hicks contractions. These false labor contractions are sometimes confused with true labor. What are Braxton Hicks contractions? Braxton Hicks contractions are tightening movements that occur in the muscles of the uterus before labor. Unlike true labor contractions, these contractions do not result in opening (dilation) and thinning of the cervix. Toward the end of pregnancy (32-34 weeks), Braxton Hicks contractions can happen more often and may become stronger. These contractions are sometimes difficult to tell apart from true labor because they can be very uncomfortable. You should not feel embarrassed if you go to the hospital with false labor. Sometimes, the only way to tell if you are in true labor is for your health care provider to look for changes in the cervix. The health care provider will do a physical exam and may monitor your contractions. If you are not in true labor, the exam should show that your cervix is not dilating and your water has not broken. If there are no other health problems associated with your pregnancy, it is completely safe for you to be sent home with false labor. You may continue to have Braxton Hicks contractions until you go into true labor. How to tell the difference between true labor and false labor True labor  Contractions last 30-70 seconds.  Contractions become very regular.  Discomfort is usually felt in the top of the uterus, and it spreads to the lower abdomen and low back.  Contractions do not go away with walking.  Contractions usually become more intense and increase in frequency.  The cervix dilates and gets thinner. False labor  Contractions are usually shorter and not as strong as true labor contractions.  Contractions are usually irregular.  Contractions  are often felt in the front of the lower abdomen and in the groin.  Contractions may go away when you walk around or change positions while lying down.  Contractions get weaker and are shorter-lasting as time goes on.  The cervix usually does not dilate or become thin. Follow these instructions at home:   Take over-the-counter and prescription medicines only as told by your health care provider.  Keep up with your usual exercises and follow other instructions from your health care provider.  Eat and drink lightly if you think you are going into labor.  If Braxton Hicks contractions are making you uncomfortable: ? Change your position from lying down or resting to walking, or change from walking to resting. ? Sit and rest in a tub of warm water. ? Drink enough fluid to keep your urine pale yellow. Dehydration may cause these contractions. ? Do slow and deep breathing several times an hour.  Keep all follow-up prenatal visits as told by your health care provider. This is important. Contact a health care provider if:  You have a fever.  You have continuous pain in your abdomen. Get help right away if:  Your contractions become stronger, more regular, and closer together.  You have fluid leaking or gushing from your vagina.  You pass blood-tinged mucus (bloody show).  You have bleeding from your vagina.  You have low back pain that you never had before.  You feel your baby's head pushing down and causing pelvic pressure.  Your baby is not moving inside you as much as it used to. Summary  Contractions that occur before labor are   called Braxton Hicks contractions, false labor, or practice contractions.  Braxton Hicks contractions are usually shorter, weaker, farther apart, and less regular than true labor contractions. True labor contractions usually become progressively stronger and regular, and they become more frequent.  Manage discomfort from Braxton Hicks contractions  by changing position, resting in a warm bath, drinking plenty of water, or practicing deep breathing. This information is not intended to replace advice given to you by your health care provider. Make sure you discuss any questions you have with your health care provider. Document Released: 09/09/2016 Document Revised: 02/08/2017 Document Reviewed: 09/09/2016 Elsevier Interactive Patient Education  2019 [employer]  

## 2018-08-29 ENCOUNTER — Other Ambulatory Visit: Payer: Self-pay | Admitting: Certified Nurse Midwife

## 2018-08-31 ENCOUNTER — Inpatient Hospital Stay: Payer: BLUE CROSS/BLUE SHIELD | Admitting: Anesthesiology

## 2018-08-31 ENCOUNTER — Inpatient Hospital Stay
Admission: EM | Admit: 2018-08-31 | Discharge: 2018-09-02 | DRG: 807 | Disposition: A | Discharge status: Home / Self Care | Payer: BLUE CROSS/BLUE SHIELD | Attending: Certified Nurse Midwife | Admitting: Certified Nurse Midwife

## 2018-08-31 ENCOUNTER — Other Ambulatory Visit: Payer: Self-pay

## 2018-08-31 DIAGNOSIS — Z3A39 39 weeks gestation of pregnancy: Secondary | ICD-10-CM

## 2018-08-31 DIAGNOSIS — O99824 Streptococcus B carrier state complicating childbirth: Secondary | ICD-10-CM

## 2018-08-31 DIAGNOSIS — B951 Streptococcus, group B, as the cause of diseases classified elsewhere: Secondary | ICD-10-CM

## 2018-08-31 DIAGNOSIS — O26893 Other specified pregnancy related conditions, third trimester: Secondary | ICD-10-CM | POA: Diagnosis present

## 2018-08-31 LAB — TYPE AND SCREEN
ABO/RH(D): B POS
Antibody Screen: NEGATIVE

## 2018-08-31 LAB — CBC
HCT: 33.5 % — ABNORMAL LOW (ref 36.0–46.0)
Hemoglobin: 10.8 g/dL — ABNORMAL LOW (ref 12.0–15.0)
MCH: 27.4 pg (ref 26.0–34.0)
MCHC: 32.2 g/dL (ref 30.0–36.0)
MCV: 85 fL (ref 80.0–100.0)
Platelets: 207 10*3/uL (ref 150–400)
RBC: 3.94 MIL/uL (ref 3.87–5.11)
RDW: 13.5 % (ref 11.5–15.5)
WBC: 6.9 10*3/uL (ref 4.0–10.5)
nRBC: 0 % (ref 0.0–0.2)

## 2018-08-31 MED ORDER — DIBUCAINE (PERIANAL) 1 % EX OINT: 1.0000 "application " | TOPICAL_OINTMENT | CUTANEOUS | Status: DC | PRN

## 2018-08-31 MED ORDER — SODIUM CHLORIDE 0.9 % IV SOLN
5.0000 10*6.[IU] | Freq: Once | INTRAVENOUS | Status: AC
  Administered 2018-08-31: 09:00:00 5 10*6.[IU] via INTRAVENOUS
  Filled 2018-08-31: qty 5

## 2018-08-31 MED ORDER — FERROUS SULFATE 325 (65 FE) MG PO TABS
325.0000 mg | ORAL_TABLET | Freq: Every day | ORAL | Status: DC
  Administered 2018-09-01 – 2018-09-02 (×2): 325 mg via ORAL
  Filled 2018-08-31 (×2): qty 1

## 2018-08-31 MED ORDER — EPHEDRINE 5 MG/ML INJ
10.0000 mg | INTRAVENOUS | Status: DC | PRN
  Filled 2018-08-31: qty 2

## 2018-08-31 MED ORDER — SOD CITRATE-CITRIC ACID 500-334 MG/5ML PO SOLN: 30.0000 mL | ORAL | Status: DC | PRN

## 2018-08-31 MED ORDER — ONDANSETRON HCL 4 MG/2ML IJ SOLN: 4.0000 mg | Freq: Four times a day (QID) | INTRAMUSCULAR | Status: DC | PRN

## 2018-08-31 MED ORDER — MISOPROSTOL 100 MCG PO TABS
50.0000 ug | ORAL_TABLET | ORAL | Status: DC | PRN
  Administered 2018-08-31: 50 ug via VAGINAL
  Filled 2018-08-31 (×2): qty 1

## 2018-08-31 MED ORDER — MISOPROSTOL 200 MCG PO TABS
ORAL_TABLET | ORAL | Status: AC
  Filled 2018-08-31: qty 4

## 2018-08-31 MED ORDER — DIPHENHYDRAMINE HCL 50 MG/ML IJ SOLN: 12.5000 mg | INTRAMUSCULAR | Status: DC | PRN

## 2018-08-31 MED ORDER — BENZOCAINE-MENTHOL 20-0.5 % EX AERO: 1.0000 "application " | INHALATION_SPRAY | CUTANEOUS | Status: DC | PRN

## 2018-08-31 MED ORDER — OXYTOCIN 40 UNITS IN NORMAL SALINE INFUSION - SIMPLE MED
2.5000 [IU]/h | INTRAVENOUS | Status: DC
  Administered 2018-08-31: 2.5 [IU]/h via INTRAVENOUS
  Filled 2018-08-31 (×2): qty 1000

## 2018-08-31 MED ORDER — IBUPROFEN 600 MG PO TABS
600.0000 mg | ORAL_TABLET | Freq: Four times a day (QID) | ORAL | Status: DC
  Administered 2018-08-31 – 2018-09-02 (×7): 600 mg via ORAL
  Filled 2018-08-31 (×7): qty 1

## 2018-08-31 MED ORDER — LACTATED RINGERS IV SOLN
INTRAVENOUS | Status: DC
  Administered 2018-08-31: 08:00:00 via INTRAVENOUS

## 2018-08-31 MED ORDER — METHYLERGONOVINE MALEATE 0.2 MG/ML IJ SOLN
0.2000 mg | INTRAMUSCULAR | Status: DC | PRN
  Administered 2018-08-31: 0.2 mg via INTRAMUSCULAR

## 2018-08-31 MED ORDER — LACTATED RINGERS IV SOLN: 500.0000 mL | Freq: Once | INTRAVENOUS | Status: DC

## 2018-08-31 MED ORDER — TERBUTALINE SULFATE 1 MG/ML IJ SOLN: 0.2500 mg | Freq: Once | INTRAMUSCULAR | Status: DC | PRN

## 2018-08-31 MED ORDER — BENZOCAINE-MENTHOL 20-0.5 % EX AERO
INHALATION_SPRAY | CUTANEOUS | Status: AC
  Filled 2018-08-31: qty 56

## 2018-08-31 MED ORDER — PHENYLEPHRINE 40 MCG/ML (10ML) SYRINGE FOR IV PUSH (FOR BLOOD PRESSURE SUPPORT)
80.0000 ug | PREFILLED_SYRINGE | INTRAVENOUS | Status: DC | PRN
  Filled 2018-08-31: qty 10

## 2018-08-31 MED ORDER — ONDANSETRON HCL 4 MG/2ML IJ SOLN: 4.0000 mg | INTRAMUSCULAR | Status: DC | PRN

## 2018-08-31 MED ORDER — SIMETHICONE 80 MG PO CHEW: 80.0000 mg | CHEWABLE_TABLET | ORAL | Status: DC | PRN

## 2018-08-31 MED ORDER — BUPIVACAINE HCL (PF) 0.25 % IJ SOLN
INTRAMUSCULAR | Status: DC | PRN
  Administered 2018-08-31: 5 mL via EPIDURAL

## 2018-08-31 MED ORDER — METHYLERGONOVINE MALEATE 0.2 MG/ML IJ SOLN
INTRAMUSCULAR | Status: AC
  Administered 2018-08-31: 0.2 mg
  Filled 2018-08-31: qty 1

## 2018-08-31 MED ORDER — OXYTOCIN 40 UNITS IN NORMAL SALINE INFUSION - SIMPLE MED
1.0000 m[IU]/min | INTRAVENOUS | Status: DC
  Administered 2018-08-31: 14:00:00 2 m[IU]/min via INTRAVENOUS

## 2018-08-31 MED ORDER — METHYLERGONOVINE MALEATE 0.2 MG PO TABS
0.2000 mg | ORAL_TABLET | ORAL | Status: DC | PRN
  Filled 2018-08-31: qty 1

## 2018-08-31 MED ORDER — BUTORPHANOL TARTRATE 1 MG/ML IJ SOLN: 1.0000 mg | INTRAMUSCULAR | Status: DC | PRN

## 2018-08-31 MED ORDER — PENICILLIN G 3 MILLION UNITS IVPB - SIMPLE MED
3.0000 10*6.[IU] | INTRAVENOUS | Status: DC
  Administered 2018-08-31: 13:00:00 3 10*6.[IU] via INTRAVENOUS
  Filled 2018-08-31 (×4): qty 100
  Filled 2018-08-31: qty 3
  Filled 2018-08-31 (×3): qty 100

## 2018-08-31 MED ORDER — COCONUT OIL OIL
1.0000 "application " | TOPICAL_OIL | Status: DC | PRN
  Administered 2018-09-01: 1 via TOPICAL
  Filled 2018-08-31: qty 120

## 2018-08-31 MED ORDER — SENNOSIDES-DOCUSATE SODIUM 8.6-50 MG PO TABS
2.0000 | ORAL_TABLET | ORAL | Status: DC
  Administered 2018-08-31: 2 via ORAL
  Filled 2018-08-31 (×3): qty 2

## 2018-08-31 MED ORDER — OXYTOCIN 10 UNIT/ML IJ SOLN
INTRAMUSCULAR | Status: AC
  Filled 2018-08-31: qty 2

## 2018-08-31 MED ORDER — OXYTOCIN BOLUS FROM INFUSION
500.0000 mL | Freq: Once | INTRAVENOUS | Status: AC
  Administered 2018-08-31: 500 mL via INTRAVENOUS

## 2018-08-31 MED ORDER — ONDANSETRON HCL 4 MG PO TABS: 4.0000 mg | ORAL_TABLET | ORAL | Status: DC | PRN

## 2018-08-31 MED ORDER — OXYCODONE-ACETAMINOPHEN 5-325 MG PO TABS: 1.0000 | ORAL_TABLET | ORAL | Status: DC | PRN

## 2018-08-31 MED ORDER — AMMONIA AROMATIC IN INHA
RESPIRATORY_TRACT | Status: AC
  Filled 2018-08-31: qty 10

## 2018-08-31 MED ORDER — LIDOCAINE HCL (PF) 1 % IJ SOLN
INTRAMUSCULAR | Status: AC
  Filled 2018-08-31: qty 30

## 2018-08-31 MED ORDER — PRENATAL MULTIVITAMIN CH
1.0000 | ORAL_TABLET | Freq: Every day | ORAL | Status: DC
  Administered 2018-09-01 – 2018-09-02 (×2): 1 via ORAL
  Filled 2018-08-31 (×2): qty 1

## 2018-08-31 MED ORDER — FENTANYL 2.5 MCG/ML W/ROPIVACAINE 0.15% IN NS 100 ML EPIDURAL (ARMC)
EPIDURAL | Status: AC
  Filled 2018-08-31: qty 100

## 2018-08-31 MED ORDER — LIDOCAINE HCL (PF) 1 % IJ SOLN: 30.0000 mL | INTRAMUSCULAR | Status: DC | PRN

## 2018-08-31 MED ORDER — OXYCODONE-ACETAMINOPHEN 5-325 MG PO TABS: 2.0000 | ORAL_TABLET | ORAL | Status: DC | PRN

## 2018-08-31 MED ORDER — LACTATED RINGERS IV SOLN: 500.0000 mL | INTRAVENOUS | Status: DC | PRN

## 2018-08-31 MED ORDER — FENTANYL 2.5 MCG/ML W/ROPIVACAINE 0.15% IN NS 100 ML EPIDURAL (ARMC)
12.0000 mL/h | EPIDURAL | Status: DC
  Administered 2018-08-31: 13:00:00 12 mL/h via EPIDURAL

## 2018-08-31 MED ORDER — ACETAMINOPHEN 325 MG PO TABS: 650.0000 mg | ORAL_TABLET | ORAL | Status: DC | PRN

## 2018-08-31 MED ORDER — WITCH HAZEL-GLYCERIN EX PADS
1.0000 "application " | MEDICATED_PAD | CUTANEOUS | Status: DC | PRN
  Filled 2018-08-31: qty 100

## 2018-08-31 MED ORDER — DOCUSATE SODIUM 100 MG PO CAPS
100.0000 mg | ORAL_CAPSULE | Freq: Two times a day (BID) | ORAL | Status: DC
  Administered 2018-09-01 – 2018-09-02 (×2): 100 mg via ORAL
  Filled 2018-08-31 (×2): qty 1

## 2018-08-31 NOTE — Progress Notes (Signed)
LABOR NOTE   Deanna Hutchinson 37 y.o.@ at [redacted]w[redacted]d Active phase labor.  SUBJECTIVE:  Comfortable with epidural  OBJECTIVE:  BP 115/84   Pulse 70   Temp 98 F (36.7 C) (Oral)   Resp 16   Ht 5\' 3"  (1.6 m)   Wt 73.5 kg   LMP  (LMP Unknown)   SpO2 100%   BMI 28.70 kg/m  No intake/output data recorded.  She has shown cervical change. CERVIX: 10:  100%:   +1:   mid position:   SVE:   Dilation: 10 Effacement (%): 100 Station: Plus 1 Exam by:: Missy Sabins CNM CONTRACTIONS: regular, every 2-3 minutes FHR: Fetal heart tracing reviewed. Baseline: 130 bpm, Variability: Good {> 6 bpm), Accelerations: Reactive and Decelerations: Early Category II   Analgesia: Epidural  Labs: Lab Results  Component Value Date   WBC 6.9 08/31/2018   HGB 10.8 (L) 08/31/2018   HCT 33.5 (L) 08/31/2018   MCV 85.0 08/31/2018   PLT 207 08/31/2018    ASSESSMENT: 1) Labor curve reviewed.       Progress: Active phase labor.     Membranes: ruptured, clear fluid     Active Problems:   Labor and delivery, indication for care Advance Maternal Age  PLAN: continue present management  Doreene Burke, CNM  08/31/2018 4:16 PM

## 2018-08-31 NOTE — Anesthesia Procedure Notes (Signed)
Epidural Patient location during procedure: OB  Staffing Anesthesiologist: Masayuki Sakai, MD Performed: anesthesiologist   Preanesthetic Checklist Completed: patient identified, site marked, surgical consent, pre-op evaluation, timeout performed, IV checked, risks and benefits discussed and monitors and equipment checked  Epidural Patient position: sitting Prep: ChloraPrep Patient monitoring: heart rate, continuous pulse ox and blood pressure Approach: midline Location: L4-L5 Injection technique: LOR saline  Needle:  Needle type: Tuohy  Needle gauge: 18 G Needle length: 9 cm and 9 Catheter type: closed end flexible Catheter size: 20 Guage Test dose: negative and 1.5% lidocaine with Epi 1:200 K  Assessment Sensory level: T10 Events: blood not aspirated, injection not painful, no injection resistance, negative IV test and no paresthesia  Additional Notes   Patient tolerated the insertion well without complications.Reason for block:procedure for pain     

## 2018-08-31 NOTE — Anesthesia Preprocedure Evaluation (Signed)
Anesthesia Evaluation  Patient identified by MRN, date of birth, ID band Patient awake    Reviewed: Allergy & Precautions, NPO status , Patient's Chart, lab work & pertinent test results, reviewed documented beta blocker date and time   Airway Mallampati: II  TM Distance: >3 FB     Dental  (+) Chipped   Pulmonary           Cardiovascular      Neuro/Psych    GI/Hepatic   Endo/Other    Renal/GU      Musculoskeletal   Abdominal   Peds  Hematology   Anesthesia Other Findings   Reproductive/Obstetrics                             Anesthesia Physical Anesthesia Plan  ASA: II  Anesthesia Plan: Epidural   Post-op Pain Management:    Induction:   PONV Risk Score and Plan:   Airway Management Planned:   Additional Equipment:   Intra-op Plan:   Post-operative Plan:   Informed Consent: I have reviewed the patients History and Physical, chart, labs and discussed the procedure including the risks, benefits and alternatives for the proposed anesthesia with the patient or authorized representative who has indicated his/her understanding and acceptance.     Plan Discussed with: CRNA  Anesthesia Plan Comments:         Anesthesia Quick Evaluation  

## 2018-08-31 NOTE — H&P (Signed)
History and Physical   HPI  Deanna Hutchinson is a 38 y.o. G3P2002 at 7641w1d Estimated Date of Delivery: 09/06/18 who is being admitted for  induction of labor due to advance maternal age.   OB History  OB History  Gravida Para Term Preterm AB Living  3 2 2  0 0 2  SAB TAB Ectopic Multiple Live Births  0 0 0 0 2    # Outcome Date GA Lbr Len/2nd Weight Sex Delivery Anes PTL Lv  3 Current           2 Term 02/14/10 3575w0d  4224 g F Vag-Spont  N LIV  1 Term 07/24/98 9067w0d  3629 g F Vag-Spont  N LIV    PROBLEM LIST  Pregnancy complications or risks: Patient Active Problem List   Diagnosis Date Noted  . Labor and delivery, indication for care 08/31/2018  . Group beta Strep positive 08/09/2018  . Abnormal maternal serum screening test   . Advanced maternal age in multigravida, second trimester   . Encounter for supervision of normal first pregnancy in first trimester 02/22/2018  . 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 Vaginal Pap smear, abnormal      Past Surgical History:  Procedure Laterality Date  . BREAST ENHANCEMENT SURGERY Bilateral   . BREAST SURGERY    . NOSE SURGERY       Medications    Current Discharge Medication List    CONTINUE these medications which have NOT CHANGED   Details  Iron-FA-B Cmp-C-Biot-Probiotic (FUSION PLUS) CAPS Take 1 tablet by mouth daily. Qty: 30 capsule, Refills: 11    Prenatal Vit-Fe Fumarate-FA (PRENATAL MULTIVITAMIN) TABS tablet Take 1 tablet by mouth daily at 12 noon. Qty: 30 tablet, Refills: 6    hydrocortisone (ANUSOL-HC) 2.5 % rectal cream Place 1 application rectally 2 (two) times daily. Qty: 30 g, Refills: 0         Allergies  Patient has no known  allergies.  Review of Systems  Constitutional: negative Eyes: negative Ears, nose, mouth, throat, and face: negative Respiratory: negative Cardiovascular: negative Gastrointestinal: negative Genitourinary:negative Integument/breast: negative Hematologic/lymphatic: negative Musculoskeletal:negative Neurological: negative Behavioral/Psych: negative Endocrine: negative Allergic/Immunologic: negative  Physical Exam  BP 108/76   Pulse 82   Temp 97.9 F (36.6 C) (Oral)   Resp 16   Ht 5\' 3"  (1.6 m)   Wt 73.5 kg   LMP  (LMP Unknown)   SpO2 100%   BMI 28.70 kg/m   Lungs:  CTA B Cardio: RRR  Abd: Soft, gravid, NT Presentation: cephalic EXT: No C/C/ 1+ Edema DTRs: 2+ B CERVIX: Dilation: 4 Effacement (%): 70 Cervical Position: Middle Station: -2 Presentation: Vertex Exam by:: Laural BenesB. Nielsen RN   See Prenatal records for more detailed PE.     FHR:  Baseline: 145 bpm, Variability: Good {> 6 bpm), Accelerations: Reactive and Decelerations: Absent  Toco: Uterine Contractions: Frequency: Every 2-3 minutes and Duration: 60-90 seconds    Test Results  Results for orders placed or performed during the hospital encounter of 08/31/18 (from the past 24 hour(s))  CBC     Status: Abnormal   Collection Time: 08/31/18  8:05 AM  Result Value Ref Range   WBC 6.9 4.0 - 10.5  K/uL   RBC 3.94 3.87 - 5.11 MIL/uL   Hemoglobin 10.8 (L) 12.0 - 15.0 g/dL   HCT 21.6 (L) 24.4 - 69.5 %   MCV 85.0 80.0 - 100.0 fL   MCH 27.4 26.0 - 34.0 pg   MCHC 32.2 30.0 - 36.0 g/dL   RDW 07.2 25.7 - 50.5 %   Platelets 207 150 - 400 K/uL   nRBC 0.0 0.0 - 0.2 %  Type and screen     Status: None   Collection Time: 08/31/18  8:05 AM  Result Value Ref Range   ABO/RH(D) B POS    Antibody Screen NEG    Sample Expiration      09/03/2018 Performed at [facility], [address] [place], [place] [address]    Group B Strep positive   Assessment   G3P2002 at [redacted]w[redacted]d Estimated Date of  Delivery: 09/06/18  The fetus is reassuring.    Patient Active Problem List   Diagnosis Date Noted  . Labor and delivery, indication for care 08/31/2018  . Group beta Strep positive 08/09/2018  . Abnormal maternal serum screening test   . Advanced maternal age in multigravida, second trimester   . Encounter for supervision of normal first pregnancy in first trimester 02/22/2018  . 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 Admitted to L&D :   cytotec x 1 dose, pitocin as needed, AROM-clear 2. EFM:-- Category 1 3. Epidural if desired.  Stadol for IV pain until epidural requested. 4. Admission labs completed 5. Anticipate NSVD  Doreene Burke, CNM  08/31/2018 12:34 PM

## 2018-09-01 LAB — CBC
HCT: 33.8 % — ABNORMAL LOW (ref 36.0–46.0)
Hemoglobin: 10.9 g/dL — ABNORMAL LOW (ref 12.0–15.0)
MCH: 27.3 pg (ref 26.0–34.0)
MCHC: 32.2 g/dL (ref 30.0–36.0)
MCV: 84.5 fL (ref 80.0–100.0)
Platelets: 196 10*3/uL (ref 150–400)
RBC: 4 MIL/uL (ref 3.87–5.11)
RDW: 13.3 % (ref 11.5–15.5)
WBC: 11 10*3/uL — ABNORMAL HIGH (ref 4.0–10.5)
nRBC: 0 % (ref 0.0–0.2)

## 2018-09-01 LAB — RPR, QUANT+TP ABS (REFLEX)
Rapid Plasma Reagin, Quant: 1:1 {titer} — ABNORMAL HIGH
T Pallidum Abs: NONREACTIVE

## 2018-09-01 LAB — RPR: RPR Ser Ql: REACTIVE — AB

## 2018-09-01 MED ORDER — DOCUSATE SODIUM 100 MG PO CAPS: 100.0000 mg | ORAL_CAPSULE | Freq: Two times a day (BID) | ORAL | 0 refills | Status: DC

## 2018-09-01 MED ORDER — IBUPROFEN 600 MG PO TABS: 600.0000 mg | ORAL_TABLET | Freq: Four times a day (QID) | ORAL | 0 refills | Status: DC

## 2018-09-01 MED ORDER — DIBUCAINE (PERIANAL) 1 % EX OINT: 1.0000 "application " | TOPICAL_OINTMENT | CUTANEOUS | 1 refills | Status: DC | PRN

## 2018-09-01 MED ORDER — ACETAMINOPHEN 325 MG PO TABS: 650.0000 mg | ORAL_TABLET | ORAL | 0 refills | Status: DC | PRN

## 2018-09-01 NOTE — Anesthesia Postprocedure Evaluation (Signed)
Anesthesia Post Note  Patient: Deanna Hutchinson  Procedure(s) Performed: AN AD HOC LABOR EPIDURAL  Patient location during evaluation: Mother Baby Anesthesia Type: Epidural Level of consciousness: awake and alert Pain management: pain level controlled Vital Signs Assessment: post-procedure vital signs reviewed and stable Respiratory status: spontaneous breathing, nonlabored ventilation and respiratory function stable Cardiovascular status: stable Postop Assessment: no headache, no backache and epidural receding Anesthetic complications: no     Last Vitals:  Vitals:   09/01/18 0300 09/01/18 0747  BP: 107/69 99/64  Pulse: 80 71  Resp: 18 20  Temp: 37.1 C 36.9 C  SpO2: 100% 97%    Last Pain:  Vitals:   09/01/18 0747  TempSrc: Oral  PainSc:                  [name]

## 2018-09-01 NOTE — Lactation Note (Signed)
This note was copied from a baby's chart. Lactation Consultation Note  Patient Name: Deanna Hutchinson SELTR'V Date: 09/01/2018 Reason for consult: Initial assessment Mom desires to give formula after feeds, states she has no milk, shown how to hand express, swallows heard while nursing, mom informed that supplementation is not necessary since blood sugars are fine, but mom insisits    Maternal Data Formula Feeding for Exclusion: No Has patient been taught Hand Expression?: Yes Does the patient have breastfeeding experience prior to this delivery?: Yes breastfed first child x 1 yr, she is now 84 yr old, 2nd child breastfed x 4 mths, stopped because of crack in right nipple, child is now 94 yrs old Feeding Feeding Type: Breast Fed  LATCH Score Latch: Grasps breast easily, tongue down, lips flanged, rhythmical sucking.  Audible Swallowing: A few with stimulation  Type of Nipple: Everted at rest and after stimulation  Comfort (Breast/Nipple): Filling, red/small blisters or bruises, mild/mod discomfort  Hold (Positioning): Assistance needed to correctly position infant at breast and maintain latch.  LATCH Score: 7  Interventions Interventions: Breast feeding basics reviewed;Assisted with latch;Skin to skin;Hand express;Adjust position;Support pillows;Position options;Coconut oil;Comfort gels  Lactation Tools Discussed/Used WIC Program: No Both mom and dad have lost their jobs because of coronavirus, they have a donated medela pump, I gave them a new pump kit and reviewed use of the pump as they do not have an instruction book  Consult Status Consult Status: Follow-up Date: 09/02/18 Follow-up type: In-patient    [name] 09/01/2018, 5:22 PM

## 2018-09-01 NOTE — Final Progress Note (Signed)
Discharge Day SOAP Note:  Progress Note - Vaginal Delivery  Deanna Hutchinson is a 38 y.o. G3P3003 now PP day 1 s/p Vaginal, Spontaneous . Delivery was uncomplicated  Subjective  The patient has the following complaints: has no unusual complaints  Pain is controlled with current medications.   Patient is urinating without difficulty.  She is ambulating well.     Objective  Vital signs: BP 99/64 (BP Location: Right Arm)   Pulse 71   Temp 98.5 F (36.9 C) (Oral)   Resp 20   Ht 5\' 3"  (1.6 m)   Wt 73.5 kg   LMP  (LMP Unknown)   SpO2 97% Comment: ROOM AIR  Breastfeeding Unknown   BMI 28.70 kg/m   Physical Exam: Gen: NAD Fundus Fundal Tone: Firm  Lochia Amount: Small  Perineum Appearance: Intact     Data Review Labs: CBC Latest Ref Rng & Units 09/01/2018 08/31/2018 06/12/2018  WBC 4.0 - 10.5 K/uL 11.0(H) 6.9 7.3  Hemoglobin 12.0 - 15.0 g/dL 10.9(L) 10.8(L) 9.3(L)  Hematocrit 36.0 - 46.0 % 33.8(L) 33.5(L) 28.1(L)  Platelets 150 - 400 K/uL 196 207 219   B POS  Assessment/Plan  Active Problems:   Labor and delivery, indication for care    Plan for discharge today.   Discharge Instructions: Per After Visit Summary. Activity: Advance as tolerated. Pelvic rest for 6 weeks.  Also refer to After Visit Summary Diet: Regular Medications: Allergies as of 09/01/2018   No Known Allergies     Medication List    STOP taking these medications   Fusion Plus Caps   hydrocortisone 2.5 % rectal cream Commonly known as:  ANUSOL-HC     TAKE these medications   acetaminophen 325 MG tablet Commonly known as:  TYLENOL Take 2 tablets (650 mg total) by mouth every 4 (four) hours as needed (for pain scale < 4ORtemperature>/=100.5 F).   dibucaine 1 % Oint Commonly known as:  NUPERCAINAL Place 1 application rectally as needed for hemorrhoids.   docusate sodium 100 MG capsule Commonly known as:  COLACE Take 1 capsule (100 mg total) by mouth 2 (two) times daily.   ibuprofen 600  MG tablet Commonly known as:  ADVIL Take 1 tablet (600 mg total) by mouth every 6 (six) hours.   prenatal multivitamin Tabs tablet Take 1 tablet by mouth daily at 12 noon.      Outpatient follow up: 6 wks with Doreene Burke, CNM  Postpartum contraception: Plans IUD Discharged Condition: good  Discharged to: home  Newborn Data: Disposition:home with mother  Apgars: APGAR (1 MIN): 9   APGAR (5 MINS): 9   APGAR (10 MINS):    Baby Feeding: Breast    Doreene Burke, CNM  09/01/2018 10:42 AM

## 2018-09-01 NOTE — Discharge Summary (Signed)
Discharge Summary  Date of Admission: 08/31/2018  Date of Discharge: 09/01/2018  Admitting Diagnosis: Induction of labor at [redacted]w[redacted]d for AMA  Mode of Delivery: normal spontaneous vaginal delivery                 Discharge Diagnosis: No other diagnosis   Intrapartum Procedures: epidural, GBS prophylaxis and pitocin augmentation   Post partum procedures: none  Complications: none                      Discharge Day SOAP Note:  Progress Note - Vaginal Delivery  Deanna Hutchinson is a 38 y.o. G3P3003 now PP day 1 s/p Vaginal, Spontaneous . Delivery was uncomplicated  Subjective  The patient has the following complaints: has no unusual complaints  Pain is controlled with current medications.   Patient is urinating without difficulty.  She is ambulating well.     Objective  Vital signs: BP 99/64 (BP Location: Right Arm)   Pulse 71   Temp 98.5 F (36.9 C) (Oral)   Resp 20   Ht 5\' 3"  (1.6 m)   Wt 73.5 kg   LMP  (LMP Unknown)   SpO2 97% Comment: ROOM AIR  Breastfeeding Unknown   BMI 28.70 kg/m   Physical Exam: Gen: NAD Fundus Fundal Tone: Firm  Lochia Amount: Small  Perineum Appearance: Intact     Data Review Labs: CBC Latest Ref Rng & Units 09/01/2018 08/31/2018 06/12/2018  WBC 4.0 - 10.5 K/uL 11.0(H) 6.9 7.3  Hemoglobin 12.0 - 15.0 g/dL 10.9(L) 10.8(L) 9.3(L)  Hematocrit 36.0 - 46.0 % 33.8(L) 33.5(L) 28.1(L)  Platelets 150 - 400 K/uL 196 207 219   B POS  Assessment/Plan  Active Problems:   Labor and delivery, indication for care    Plan for discharge today.   Discharge Instructions: Per After Visit Summary. Activity: Advance as tolerated. Pelvic rest for 6 weeks.  Also refer to After Visit Summary Diet: Regular Medications: Allergies as of 09/01/2018   No Known Allergies     Medication List    STOP taking these medications   Fusion Plus Caps   hydrocortisone 2.5 % rectal cream Commonly known as:  ANUSOL-HC     TAKE these  medications   acetaminophen 325 MG tablet Commonly known as:  TYLENOL Take 2 tablets (650 mg total) by mouth every 4 (four) hours as needed (for pain scale < 4ORtemperature>/=100.5 F).   dibucaine 1 % Oint Commonly known as:  NUPERCAINAL Place 1 application rectally as needed for hemorrhoids.   docusate sodium 100 MG capsule Commonly known as:  COLACE Take 1 capsule (100 mg total) by mouth 2 (two) times daily.   ibuprofen 600 MG tablet Commonly known as:  ADVIL Take 1 tablet (600 mg total) by mouth every 6 (six) hours.   prenatal multivitamin Tabs tablet Take 1 tablet by mouth daily at 12 noon.      Outpatient follow up: 6 wks with Doreene Burke, CNM  Postpartum contraception: Plans IUD Discharged Condition: good  Discharged to: home  Newborn Data: Disposition:home with mother  Apgars: APGAR (1 MIN): 9   APGAR (5 MINS): 9   APGAR (10 MINS):    Baby Feeding: Breast    Doreene Burke, CNM  09/01/2018 10:42 AM

## 2018-09-02 NOTE — Discharge Instructions (Signed)
Call your doctor for increased pain or vaginal bleeding, temperature above 100.4, depression, or concerns.  Increase calories and fluids while breastfeeding.  Continue prenatal vitamin and iron.  No strenuous activity or heavy lifting for 6 weeks. No intercourse, tampons, or douching for 6 weeks.  No tub baths- showers only.  No driving for 2 weeks or while taking pain medications.   °

## 2018-09-02 NOTE — Discharge Summary (Signed)
Pt and baby DC'd home with FOB via WC. Tags and hugs checked and removed security band removed. Baby did not have cord clamp at time of discharge. Verlon Au, RN wheeled pt down

## 2018-09-02 NOTE — Progress Notes (Signed)
Contacted Doreene Burke, CNM to verify RPR results.  CNM confirms that result is not indicative of infection and that pt is safe to discharge home.  Prenatal records indicate that TDaP vaccine was given on 06/12/18 and Influenza vaccine was given in November, 2019.  Reynold Bowen, RN 09/02/2018 3:05 PM

## 2018-09-02 NOTE — Lactation Note (Signed)
This note was copied from a baby's chart. Lactation Consultation Note  Patient Name: Girl Quintessa Quillen RUEAV'W Date: 09/02/2018 Reason for consult: Follow-up assessment;Mother's request;Term;Nipple pain/trauma;Breast augmentation;Other (Comment)(Breast implants 2 years ago) Assisted mom with breast feeding.  Mom concerned that she has no milk.  She had breast implants inserted under breasts 2 years ago.  Demonstrated hand expression of a few drops, but was very painful when tried to hand express on right side.  Mom was able to put baby to left breast, but could not tolerate when tried to put baby to right breast.  Right nipple is red and cracked.  Mom reports baby has been biting and pinching on that side.  Mom has a Medela DEBP at home that she has borrowed from a friend.  Symphony set up in room for mom to pump right breast.  She only got a few drops.  Explained supply and demand, normal course of lactation and routine newborn feeding patterns.  Mom says she is going to pump right breast until that nipple has healed and then plans to go back to solely breast feeding.  With 40 yr old, she breast fed for 1 year without any problems.  With 40 year old she only breast fed for 4 months, because kept having recurrent cracking and bleeding nipples stating 38 year old never breast fed as good as 3 year old.  Mom and baby plan to be discharged today.  Lactation contact numbers given and encouraged to call with any questions or concerns.       Maternal Data Formula Feeding for Exclusion: No Has patient been taught Hand Expression?: Yes Does the patient have breastfeeding experience prior to this delivery?: Yes  Feeding Feeding Type: Breast Fed  LATCH Score Latch: Repeated attempts needed to sustain latch, nipple held in mouth throughout feeding, stimulation needed to elicit sucking reflex.  Audible Swallowing: None  Type of Nipple: Everted at rest and after stimulation  Comfort (Breast/Nipple): Filling,  red/small blisters or bruises, mild/mod discomfort  Hold (Positioning): No assistance needed to correctly position infant at breast.  LATCH Score: 6  Interventions Interventions: Breast feeding basics reviewed;Assisted with latch;Breast massage;Hand express;Pre-pump if needed;Reverse pressure;Breast compression;Adjust position;Support pillows;Position options;Expressed milk;Coconut oil;Comfort gels;DEBP  Lactation Tools Discussed/Used Tools: Pump;Coconut oil;Comfort gels Breast pump type: Double-Electric Breast Pump WIC Program: No(BCBS) Pump Review: Setup, frequency, and cleaning;Milk Storage;Other (comment) Initiated by:: S.Devera Englander,RN,BSN,IBCLC Date initiated:: 09/02/18   Consult Status Consult Status: PRN Follow-up type: Call as needed    [place], [place]meyer 09/02/2018, 5:07 PM

## 2018-09-02 NOTE — Discharge Summary (Signed)
Discharge Summary  Date of Admission: 08/31/2018  Date of Discharge: 09/02/2018  Admitting Diagnosis: Induction of labor at [redacted]w[redacted]d for AMA  Mode of Delivery: normal spontaneous vaginal delivery                                                  Discharge Diagnosis: No other diagnosis              Intrapartum Procedures: epidural, GBS prophylaxis and pitocin augmentation              Post partum procedures: none  Complications: none                      Discharge Day SOAP Note:  Progress Note - Vaginal Delivery  Deanna Hutchinson is a 38 y.o. G3P3003 now PP day 2 s/p Vaginal, Spontaneous . Delivery was uncomplicated. Discharge was delayed due to fetal indications.   Subjective  The patient has the following complaints: has no unusual complaints  Pain is controlled with current medications.   Patient is urinating without difficulty.  She is ambulating well.     Objective  Vital signs: BP 99/64 (BP Location: Right Arm)   Pulse 71   Temp 98.5 F (36.9 C) (Oral)   Resp 20   Ht 5\' 3"  (1.6 m)   Wt 73.5 kg   LMP  (LMP Unknown)   SpO2 97% Comment: ROOM AIR  Breastfeeding Unknown   BMI 28.70 kg/m   Physical Exam: Gen: NAD Fundus Fundal Tone: Firm  Lochia Amount: Small  Perineum Appearance: Intact                Data Review Labs: CBC Latest Ref Rng & Units 09/01/2018 08/31/2018 06/12/2018  WBC 4.0 - 10.5 K/uL 11.0(H) 6.9 7.3  Hemoglobin 12.0 - 15.0 g/dL 10.9(L) 10.8(L) 9.3(L)  Hematocrit 36.0 - 46.0 % 33.8(L) 33.5(L) 28.1(L)  Platelets 150 - 400 K/uL 196 207 219   B POS  Assessment/Plan  Active Problems:   Labor and delivery, indication for care    Plan for discharge today.   Discharge Instructions: Per After Visit Summary. Activity: Advance as tolerated. Pelvic rest for 6 weeks.  Also refer to After Visit Summary Diet: Regular Medications: Allergies as of 09/01/2018   No Known Allergies        Medication List    STOP taking these  medications   Fusion Plus Caps   hydrocortisone 2.5 % rectal cream Commonly known as:  ANUSOL-HC     TAKE these medications   acetaminophen 325 MG tablet Commonly known as:  TYLENOL Take 2 tablets (650 mg total) by mouth every 4 (four) hours as needed (for pain scale < 4ORtemperature>/=100.5 F).   dibucaine 1 % Oint Commonly known as:  NUPERCAINAL Place 1 application rectally as needed for hemorrhoids.   docusate sodium 100 MG capsule Commonly known as:  COLACE Take 1 capsule (100 mg total) by mouth 2 (two) times daily.   ibuprofen 600 MG tablet Commonly known as:  ADVIL Take 1 tablet (600 mg total) by mouth every 6 (six) hours.   prenatal multivitamin Tabs tablet Take 1 tablet by mouth daily at 12 noon.      Outpatient follow up: 6 wks with Doreene Burke, CNM  Postpartum contraception: Plans IUD Discharged Condition: good  Discharged to: home  Newborn  Data: Disposition:home with mother  Apgars: APGAR (1 MIN): 9   APGAR (5 MINS): 9   APGAR (10 MINS):    Baby Feeding: Breast    Doreene Burkennie Carsin Randazzo, CNM  09/02/2018 09:42 AM

## 2018-09-02 NOTE — Final Progress Note (Signed)
Discharge Day SOAP Note:  Progress Note - Vaginal Delivery  Deanna Hutchinson is a 38 y.o. G3P3003 now PP day 2 s/p Vaginal, Spontaneous . Delivery was uncomplicated. Discharge was delayed due to fetal indications.   Subjective  The patient has the following complaints: has no unusual complaints  Pain is controlled with current medications.   Patient is urinating without difficulty.  She is ambulating well.     Objective  Vital signs: BP 99/64 (BP Location: Right Arm)   Pulse 71   Temp 98.5 F (36.9 C) (Oral)   Resp 20   Ht 5\' 3"  (1.6 m)   Wt 73.5 kg   LMP  (LMP Unknown)   SpO2 97% Comment: ROOM AIR  Breastfeeding Unknown   BMI 28.70 kg/m   Physical Exam: Gen: NAD Fundus Fundal Tone: Firm  Lochia Amount: Small  Perineum Appearance: Intact                Data Review Labs: CBC Latest Ref Rng & Units 09/01/2018 08/31/2018 06/12/2018  WBC 4.0 - 10.5 K/uL 11.0(H) 6.9 7.3  Hemoglobin 12.0 - 15.0 g/dL 10.9(L) 10.8(L) 9.3(L)  Hematocrit 36.0 - 46.0 % 33.8(L) 33.5(L) 28.1(L)  Platelets 150 - 400 K/uL 196 207 219   B POS  Assessment/Plan  Active Problems:   Labor and delivery, indication for care    Plan for discharge today.   Discharge Instructions: Per After Visit Summary. Activity: Advance as tolerated. Pelvic rest for 6 weeks.  Also refer to After Visit Summary Diet: Regular Medications: Allergies as of 09/01/2018   No Known Allergies        Medication List    STOP taking these medications   Fusion Plus Caps   hydrocortisone 2.5 % rectal cream Commonly known as:  ANUSOL-HC     TAKE these medications   acetaminophen 325 MG tablet Commonly known as:  TYLENOL Take 2 tablets (650 mg total) by mouth every 4 (four) hours as needed (for pain scale < 4ORtemperature>/=100.5 F).   dibucaine 1 % Oint Commonly known as:  NUPERCAINAL Place 1 application rectally as needed for hemorrhoids.   docusate sodium 100 MG capsule Commonly known  as:  COLACE Take 1 capsule (100 mg total) by mouth 2 (two) times daily.   ibuprofen 600 MG tablet Commonly known as:  ADVIL Take 1 tablet (600 mg total) by mouth every 6 (six) hours.   prenatal multivitamin Tabs tablet Take 1 tablet by mouth daily at 12 noon.      Outpatient follow up: 6 wks with Doreene Burke, CNM  Postpartum contraception: Plans IUD Discharged Condition: good  Discharged to: home  Newborn Data: Disposition:home with mother  Apgars: APGAR (1 MIN): 9   APGAR (5 MINS): 9   APGAR (10 MINS):    Baby Feeding: Breast    Doreene Burke, CNM  09/02/2018 09:42 AM

## 2018-10-04 ENCOUNTER — Other Ambulatory Visit: Payer: Self-pay | Admitting: Certified Nurse Midwife

## 2018-10-17 ENCOUNTER — Other Ambulatory Visit: Payer: Self-pay

## 2018-10-17 ENCOUNTER — Ambulatory Visit (INDEPENDENT_AMBULATORY_CARE_PROVIDER_SITE_OTHER): Payer: BLUE CROSS/BLUE SHIELD | Admitting: Certified Nurse Midwife

## 2018-10-17 ENCOUNTER — Telehealth: Payer: Self-pay

## 2018-10-17 ENCOUNTER — Encounter: Payer: Self-pay | Admitting: Certified Nurse Midwife

## 2018-10-17 DIAGNOSIS — M545 Low back pain, unspecified: Secondary | ICD-10-CM

## 2018-10-17 NOTE — Telephone Encounter (Signed)
Coronavirus (COVID-19) Are you at risk?  Are you at risk for the Coronavirus (COVID-19)?  To be considered HIGH RISK for Coronavirus (COVID-19), you have to meet the following criteria:  . Traveled to [place], [place], [place], [place] or [place]; or in the [place] to [place], [place], [place], or [place]; and have fever, cough, and shortness of breath within the last 2 weeks of travel OR . Been in close contact with a person diagnosed with COVID-19 within the last 2 weeks and have fever, cough, and shortness of breath . IF YOU DO NOT MEET THESE CRITERIA, YOU ARE CONSIDERED LOW RISK FOR COVID-19.  What to do if you are HIGH RISK for COVID-19?  . If you are having a medical emergency, call 911. . Seek medical care right away. Before you go to a doctor's office, urgent care or emergency department, call ahead and tell them about your recent travel, contact with someone diagnosed with COVID-19, and your symptoms. You should receive instructions from your physician's office regarding next steps of care.  . When you arrive at healthcare provider, tell the healthcare staff immediately you have returned from visiting [place], [place], [place], [place] or [place]; or traveled in the [place] to [place], [place], [place], or [place]; in the last two weeks or you have been in close contact with a person diagnosed with COVID-19 in the last 2 weeks.   . Tell the health care staff about your symptoms: fever, cough and shortness of breath. . After you have been seen by a medical provider, you will be either: o Tested for (COVID-19) and discharged home on quarantine except to seek medical care if symptoms worsen, and asked to  - Stay home and avoid contact with others until you get your results (4-5 days)  - Avoid travel on public transportation if possible (such as bus, train, or airplane) or o Sent to the Emergency Department by EMS for evaluation, COVID-19 testing, and possible  admission depending on your condition and test results.  What to do if you are LOW RISK for COVID-19?  Reduce your risk of any infection by using the same precautions used for avoiding the common cold or flu:  . Wash your hands often with soap and warm water for at least 20 seconds.  If soap and water are not readily available, use an alcohol-based hand sanitizer with at least 60% alcohol.  . If coughing or sneezing, cover your mouth and nose by coughing or sneezing into the elbow areas of your shirt or coat, into a tissue or into your sleeve (not your hands). . Avoid shaking hands with others and consider head nods or verbal greetings only. . Avoid touching your eyes, nose, or mouth with unwashed hands.  . Avoid close contact with people who are Aneyah Lortz. . Avoid places or events with large numbers of people in one location, like concerts or sporting events. . Carefully consider travel plans you have or are making. . If you are planning any travel outside or inside the [place], visit the CDC's Travelers' Health webpage for the latest health notices. . If you have some symptoms but not all symptoms, continue to monitor at home and seek medical attention if your symptoms worsen. . If you are having a medical emergency, call 911.  10/17/18 SCREENING NEG SLS ADDITIONAL HEALTHCARE OPTIONS FOR PATIENTS  [facility] / e-Visit: [email or web address]         [facility]: [phone]    [facility]: [phone]                   [facility]: [phone]  

## 2018-10-17 NOTE — Progress Notes (Signed)
Subjective:    Deanna Hutchinson is a 38 y.o. G25P3003 Asian female who presents for a postpartum visit. She is 6 weeks postpartum following a spontaneous vaginal delivery at 39.1 gestational weeks. Anesthesia: epidural. I have fully reviewed the prenatal and intrapartum course. Postpartum course has been complicated by back pain. Baby's course has been wnl. Baby is feeding by breast. Bleeding staining only. Bowel function is normal. Bladder function is normal. Patient is not sexually active.  Contraception method is plans on IUD. Postpartum depression screening: negative. Score 0.  Last pap 02/22/2018 and was ASC-US, HPV + negative for high risk type. Repeat pap due 02/2019  The following portions of the patient's history were reviewed and updated as appropriate: allergies, current medications, past medical history, past surgical history and problem list.  Review of Systems Pertinent items are noted in HPI.   There were no vitals filed for this visit. No LMP recorded.  Objective:   General:  alert, cooperative and no distress   Breasts:  deferred, no complaints  Lungs: clear to auscultation bilaterally  Heart:  regular rate and rhythm  Abdomen: soft, nontender   Vulva: normal  Vagina: normal vagina  Cervix:  closed  Corpus: Well-involuted  Adnexa:  Non-palpable  Rectal Exam: No hemorrhoids        Assessment:   Postpartum exam 6 wks s/p SVD Breastfeeding Depression screening Contraception counseling  Back pain   Plan:  : plans IUD  Follow up in: 1-2 wks for IUD placement   Referral to chiropractor for back pain   Philip Aspen, CNM

## 2018-10-17 NOTE — Patient Instructions (Signed)
Sciatica Pain Exercises  Ask your health care provider which exercises are safe for you. Do exercises exactly as told by your health care provider and adjust them as directed. It is normal to feel mild stretching, pulling, tightness, or discomfort as you do these exercises, but you should stop right away if you feel sudden pain or your pain gets worse.Do not begin these exercises until told by your health care provider. Stretching and range of motion exercises These exercises warm up your muscles and joints and improve the movement and flexibility of your hips and your back. These exercises also help to relieve pain, numbness, and tingling. Exercise A: Sciatic nerve glide 1. Sit in a chair with your head facing down toward your chest. Place your hands behind your back. Let your shoulders slump forward. 2. Slowly straighten one of your knees while you tilt your head back as if you are looking toward the ceiling. Only straighten your leg as far as you can without making your symptoms worse. 3. Hold for __________ seconds. 4. Slowly return to the starting position. 5. Repeat with your other leg. Repeat __________ times. Complete this exercise __________ times a day. Exercise B: Knee to chest with hip adduction and internal rotation  1. Lie on your back on a firm surface with both legs straight. 2. Bend one of your knees and move it up toward your chest until you feel a gentle stretch in your lower back and buttock. Then, move your knee toward the shoulder that is on the opposite side from your leg. ? Hold your leg in this position by holding onto the front of your knee. 3. Hold for __________ seconds. 4. Slowly return to the starting position. 5. Repeat with your other leg. Repeat __________ times. Complete this exercise __________ times a day. Exercise C: Prone extension on elbows  1. Lie on your abdomen on a firm surface. A bed may be too soft for this exercise. 2. Prop yourself up on your  elbows. 3. Use your arms to help lift your chest up until you feel a gentle stretch in your abdomen and your lower back. ? This will place some of your body weight on your elbows. If this is uncomfortable, try stacking pillows under your chest. ? Your hips should stay down, against the surface that you are lying on. Keep your hip and back muscles relaxed. 4. Hold for __________ seconds. 5. Slowly relax your upper body and return to the starting position. Repeat __________ times. Complete this exercise __________ times a day. Strengthening exercises These exercises build strength and endurance in your back. Endurance is the ability to use your muscles for a long time, even after they get tired. Exercise D: Pelvic tilt 1. Lie on your back on a firm surface. Bend your knees and keep your feet flat. 2. Tense your abdominal muscles. Tip your pelvis up toward the ceiling and flatten your lower back into the floor. ? To help with this exercise, you may place a small towel under your lower back and try to push your back into the towel. 3. Hold for __________ seconds. 4. Let your muscles relax completely before you repeat this exercise. Repeat __________ times. Complete this exercise __________ times a day. Exercise E: Alternating arm and leg raises  1. Get on your hands and knees on a firm surface. If you are on a hard floor, you may want to use padding to cushion your knees, such as an exercise mat. 2. Line up  your arms and legs. Your hands should be below your shoulders, and your knees should be below your hips. 3. Lift your left leg behind you. At the same time, raise your right arm and straighten it in front of you. ? Do not lift your leg higher than your hip. ? Do not lift your arm higher than your shoulder. ? Keep your abdominal and back muscles tight. ? Keep your hips facing the ground. ? Do not arch your back. ? Keep your balance carefully, and do not hold your breath. 4. Hold for  __________ seconds. 5. Slowly return to the starting position and repeat with your right leg and your left arm. Repeat __________ times. Complete this exercise __________ times a day. Posture and body mechanics  Body mechanics refers to the movements and positions of your body while you do your daily activities. Posture is part of body mechanics. Good posture and healthy body mechanics can help to relieve stress in your body's tissues and joints. Good posture means that your spine is in its natural S-curve position (your spine is neutral), your shoulders are pulled back slightly, and your head is not tipped forward. The following are general guidelines for applying improved posture and body mechanics to your everyday activities. Standing   When standing, keep your spine neutral and your feet about hip-width apart. Keep a slight bend in your knees. Your ears, shoulders, and hips should line up.  When you do a task in which you stand in one place for a long time, place one foot up on a stable object that is 2-4 inches (5-10 cm) high, such as a footstool. This helps keep your spine neutral. Sitting   When sitting, keep your spine neutral and keep your feet flat on the floor. Use a footrest, if necessary, and keep your thighs parallel to the floor. Avoid rounding your shoulders, and avoid tilting your head forward.  When working at a desk or a computer, keep your desk at a height where your hands are slightly lower than your elbows. Slide your chair under your desk so you are close enough to maintain good posture.  When working at a computer, place your monitor at a height where you are looking straight ahead and you do not have to tilt your head forward or downward to look at the screen. Resting   When lying down and resting, avoid positions that are most painful for you.  If you have pain with activities such as sitting, bending, stooping, or squatting (flexion-based activities), lie in a  position in which your body does not bend very much. For example, avoid curling up on your side with your arms and knees near your chest (fetal position).  If you have pain with activities such as standing for a long time or reaching with your arms (extension-based activities), lie with your spine in a neutral position and bend your knees slightly. Try the following positions: ? Lying on your side with a pillow between your knees. ? Lying on your back with a pillow under your knees. Lifting   When lifting objects, keep your feet at least shoulder-width apart and tighten your abdominal muscles.  Bend your knees and hips and keep your spine neutral. It is important to lift using the strength of your legs, not your back. Do not lock your knees straight out.  Always ask for help to lift heavy or awkward objects. This information is not intended to replace advice given to you by your  health care provider. Make sure you discuss any questions you have with your health care provider. Document Released: 04/26/2005 Document Revised: 01/01/2016 Document Reviewed: 01/10/2015 Elsevier Interactive Patient Education  2019 [place] 18-39 Years, Female Preventive care refers to lifestyle choices and visits with your health care provider that can promote health and wellness. What does preventive care include?   A yearly physical exam. This is also called an annual well check.  Dental exams once or twice a year.  Routine eye exams. Ask your health care provider how often you should have your eyes checked.  Personal lifestyle choices, including: ? Daily care of your teeth and gums. ? Regular physical activity. ? Eating a healthy diet. ? Avoiding tobacco and drug use. ? Limiting alcohol use. ? Practicing safe sex. ? Taking vitamin and mineral supplements as recommended by your health care provider. What happens during an annual well check? The services and screenings done by your  health care provider during your annual well check will depend on your age, overall health, lifestyle risk factors, and family history of disease. Counseling Your health care provider may ask you questions about your:  Alcohol use.  Tobacco use.  Drug use.  Emotional well-being.  Home and relationship well-being.  Sexual activity.  Eating habits.  Work and work Statistician.  Method of birth control.  Menstrual cycle.  Pregnancy history. Screening You may have the following tests or measurements:  Height, weight, and BMI.  Diabetes screening. This is done by checking your blood sugar (glucose) after you have not eaten for a while (fasting).  Blood pressure.  Lipid and cholesterol levels. These may be checked every 5 years starting at age 1.  Skin check.  Hepatitis C blood test.  Hepatitis B blood test.  Sexually transmitted disease (STD) testing.  BRCA-related cancer screening. This may be done if you have a family history of breast, ovarian, tubal, or peritoneal cancers.  Pelvic exam and Pap test. This may be done every 3 years starting at age 104. Starting at age 33, this may be done every 5 years if you have a Pap test in combination with an HPV test. Discuss your test results, treatment options, and if necessary, the need for more tests with your health care provider. Vaccines Your health care provider may recommend certain vaccines, such as:  Influenza vaccine. This is recommended every year.  Tetanus, diphtheria, and acellular pertussis (Tdap, Td) vaccine. You may need a Td booster every 10 years.  Varicella vaccine. You may need this if you have not been vaccinated.  HPV vaccine. If you are 43 or younger, you may need three doses over 6 months.  Measles, mumps, and rubella (MMR) vaccine. You may need at least one dose of MMR. You may also need a second dose.  Pneumococcal 13-valent conjugate (PCV13) vaccine. You may need this if you have certain  conditions and were not previously vaccinated.  Pneumococcal polysaccharide (PPSV23) vaccine. You may need one or two doses if you smoke cigarettes or if you have certain conditions.  Meningococcal vaccine. One dose is recommended if you are age 68-21 years and a first-year college student living in a residence hall, or if you have one of several medical conditions. You may also need additional booster doses.  Hepatitis A vaccine. You may need this if you have certain conditions or if you travel or work in places where you may be exposed to hepatitis A.  Hepatitis B vaccine. You may need this  if you have certain conditions or if you travel or work in places where you may be exposed to hepatitis B.  Haemophilus influenzae type b (Hib) vaccine. You may need this if you have certain risk factors. Talk to your health care provider about which screenings and vaccines you need and how often you need them. This information is not intended to replace advice given to you by your health care provider. Make sure you discuss any questions you have with your health care provider. Document Released: 06/22/2001 Document Revised: 12/07/2016 Document Reviewed: 02/25/2015 Elsevier Interactive Patient Education  2019 [employer].

## 2018-10-18 ENCOUNTER — Ambulatory Visit (INDEPENDENT_AMBULATORY_CARE_PROVIDER_SITE_OTHER): Payer: BLUE CROSS/BLUE SHIELD | Admitting: Certified Nurse Midwife

## 2018-10-18 ENCOUNTER — Encounter: Payer: Self-pay | Admitting: Certified Nurse Midwife

## 2018-10-18 VITALS — BP 95/59 | HR 84 | Ht 63.0 in | Wt 138.4 lb

## 2018-10-18 DIAGNOSIS — Z30430 Encounter for insertion of intrauterine contraceptive device: Secondary | ICD-10-CM | POA: Diagnosis not present

## 2018-10-18 NOTE — Progress Notes (Signed)
   GYNECOLOGY OFFICE PROCEDURE NOTE  Deanna Hutchinson is a 38 y.o. G3P3003 here for Mirena IUD insertion. No GYN concerns.  IUD Insertion Procedure Note Patient identified, informed consent performed, consent signed.   Discussed risks of irregular bleeding, cramping, infection, malpositioning or misplacement of the IUD outside the uterus which may require further procedure such as laparoscopy. Time out was performed.  Urine pregnancy test negative.  Speculum placed in the vagina.  Cervix visualized.  Cleaned with Betadine x 2.  Grasped anteriorly with a single tooth tenaculum.  Uterus sounded to 8 cm. Mirena  IUD placed per manufacturer's recommendations.  Strings trimmed to 3 cm. Tenaculum was removed, good hemostasis noted.  Patient tolerated procedure well.   Patient was given post-procedure instructions.  She was advised to have backup contraception for one week.  Patient was also asked to check IUD strings periodically and follow up in 4 weeks for IUD check.   Philip Aspen, CNM

## 2018-10-18 NOTE — Patient Instructions (Signed)
Intrauterine Device Insertion, Care After    This sheet gives you information about how to care for yourself after your procedure. Your health care provider may also give you more specific instructions. If you have problems or questions, contact your health care provider.  What can I expect after the procedure?  After the procedure, it is common to have:  · Cramps and pain in the abdomen.  · Light bleeding (spotting) or heavier bleeding that is like your menstrual period. This may last for up to a few days.  · Lower back pain.  · Dizziness.  · Headaches.  · Nausea.  Follow these instructions at home:  · Before resuming sexual activity, check to make sure that you can feel the IUD string(s). You should be able to feel the end of the string(s) below the opening of your cervix. If your IUD string is in place, you may resume sexual activity.  ? If you had a hormonal IUD inserted more than 7 days after your most recent period started, you will need to use a backup method of birth control for 7 days after IUD insertion. Ask your health care provider whether this applies to you.  · Continue to check that the IUD is still in place by feeling for the string(s) after every menstrual period, or once a month.  · Take over-the-counter and prescription medicines only as told by your health care provider.  · Do not drive or use heavy machinery while taking prescription pain medicine.  · Keep all follow-up visits as told by your health care provider. This is important.  Contact a health care provider if:  · You have bleeding that is heavier or lasts longer than a normal menstrual cycle.  · You have a fever.  · You have cramps or abdominal pain that get worse or do not get better with medicine.  · You develop abdominal pain that is new or is not in the same area of earlier cramping and pain.  · You feel lightheaded or weak.  · You have abnormal or bad-smelling discharge from your vagina.  · You have pain during sexual  activity.  · You have any of the following problems with your IUD string(s):  ? The string bothers or hurts you or your sexual partner.  ? You cannot feel the string.  ? The string has gotten longer.  · You can feel the IUD in your vagina.  · You think you may be pregnant, or you miss your menstrual period.  · You think you may have an STI (sexually transmitted infection).  Get help right away if:  · You have flu-like symptoms.  · You have a fever and chills.  · You can feel that your IUD has slipped out of place.  Summary  · After the procedure, it is common to have cramps and pain in the abdomen. It is also common to have light bleeding (spotting) or heavier bleeding that is like your menstrual period.  · Continue to check that the IUD is still in place by feeling for the string(s) after every menstrual period, or once a month.  · Keep all follow-up visits as told by your health care provider. This is important.  · Contact your health care provider if you have problems with your IUD string(s), such as the string getting longer or bothering you or your sexual partner.  This information is not intended to replace advice given to you by your health care provider. Make   sure you discuss any questions you have with your health care provider.  Document Released: 12/23/2010 Document Revised: 03/17/2016 Document Reviewed: 03/17/2016  Elsevier Interactive Patient Education © 2019 [employer]

## 2018-11-14 ENCOUNTER — Telehealth: Payer: Self-pay

## 2018-11-14 NOTE — Telephone Encounter (Signed)
Coronavirus (COVID-19) Are you at risk?  Are you at risk for the Coronavirus (COVID-19)?  To be considered HIGH RISK for Coronavirus (COVID-19), you have to meet the following criteria:  . Traveled to [place], [place], [place], [place] or [place]; or in the [place] to [place], [place], [place], or [place]; and have fever, cough, and shortness of breath within the last 2 weeks of travel OR . Been in close contact with a person diagnosed with COVID-19 within the last 2 weeks and have fever, cough, and shortness of breath . IF YOU DO NOT MEET THESE CRITERIA, YOU ARE CONSIDERED LOW RISK FOR COVID-19.  What to do if you are HIGH RISK for COVID-19?  . If you are having a medical emergency, call 911. . Seek medical care right away. Before you go to a doctor's office, urgent care or emergency department, call ahead and tell them about your recent travel, contact with someone diagnosed with COVID-19, and your symptoms. You should receive instructions from your physician's office regarding next steps of care.  . When you arrive at healthcare provider, tell the healthcare staff immediately you have returned from visiting [place], [place], [place], [place] or [place]; or traveled in the [place] to [place], [place], [place], or [place]; in the last two weeks or you have been in close contact with a person diagnosed with COVID-19 in the last 2 weeks.   . Tell the health care staff about your symptoms: fever, cough and shortness of breath. . After you have been seen by a medical provider, you will be either: o Tested for (COVID-19) and discharged home on quarantine except to seek medical care if symptoms worsen, and asked to  - Stay home and avoid contact with others until you get your results (4-5 days)  - Avoid travel on public transportation if possible (such as bus, train, or airplane) or o Sent to the Emergency Department by EMS for evaluation, COVID-19 testing, and possible  admission depending on your condition and test results.  What to do if you are LOW RISK for COVID-19?  Reduce your risk of any infection by using the same precautions used for avoiding the common cold or flu:  . Wash your hands often with soap and warm water for at least 20 seconds.  If soap and water are not readily available, use an alcohol-based hand sanitizer with at least 60% alcohol.  . If coughing or sneezing, cover your mouth and nose by coughing or sneezing into the elbow areas of your shirt or coat, into a tissue or into your sleeve (not your hands). . Avoid shaking hands with others and consider head nods or verbal greetings only. . Avoid touching your eyes, nose, or mouth with unwashed hands.  . Avoid close contact with people who are Shandria Clinch. . Avoid places or events with large numbers of people in one location, like concerts or sporting events. . Carefully consider travel plans you have or are making. . If you are planning any travel outside or inside the [place], visit the CDC's Travelers' Health webpage for the latest health notices. . If you have some symptoms but not all symptoms, continue to monitor at home and seek medical attention if your symptoms worsen. . If you are having a medical emergency, call 911.  11/14/18 SCREENING NEG SLS ADDITIONAL HEALTHCARE OPTIONS FOR PATIENTS  [facility] / e-Visit: [email or web address]         [facility]: [phone]    [facility]: [phone]                   [facility]: [phone]  

## 2018-11-15 ENCOUNTER — Encounter: Payer: Self-pay | Admitting: Certified Nurse Midwife

## 2018-11-15 ENCOUNTER — Other Ambulatory Visit: Payer: Self-pay

## 2018-11-15 ENCOUNTER — Ambulatory Visit (INDEPENDENT_AMBULATORY_CARE_PROVIDER_SITE_OTHER): Payer: BLUE CROSS/BLUE SHIELD | Admitting: Certified Nurse Midwife

## 2018-11-15 VITALS — BP 113/65 | HR 76 | Ht 63.0 in | Wt 137.6 lb

## 2018-11-15 DIAGNOSIS — N941 Unspecified dyspareunia: Secondary | ICD-10-CM

## 2018-11-15 NOTE — Progress Notes (Signed)
  GYNECOLOGY OFFICE ENCOUNTER NOTE  History:  38 y.o. G3P3003 here today for today for IUD string check; Mirena  IUD was placed  10/18/18. No complaints about the IUD, She has pain with intercourse.No other concerning side effects.  The following portions of the patient's history were reviewed and updated as appropriate: allergies, current medications, past family history, past medical history, past social history, past surgical history and problem list.   Review of Systems:  Pertinent items are noted in HPI.   Objective:  Physical Exam Blood pressure 113/65, pulse 76, height 5\' 3"  (1.6 m), weight 137 lb 9 oz (62.4 kg), currently breastfeeding. CONSTITUTIONAL: Well-developed, well-nourished female in no acute distress.  HENT:  Normocephalic, atraumatic. External right and left ear normal. Oropharynx is clear and moist EYES: Conjunctivae and EOM are normal. Pupils are equal, round, and reactive to light. No scleral icterus.  NECK: Normal range of motion, supple, no masses CARDIOVASCULAR: Normal heart rate noted RESPIRATORY: Effort and breath sounds normal, no problems with respiration noted ABDOMEN: Soft, no distention noted.   PELVIC: Normal appearing external genitalia; normal appearing vaginal mucosa and cervix.  IUD strings visualized, about 3 cm in length outside cervix.   Assessment & Plan:  Patient to keep IUD in place for up to seven years; can come in for removal if she desires pregnancy earlier or for any concerning side effects. Due to pain with intercourse , u/s ordered to check for correct placement. Pt to follow up for u/s as soon as able.   Philip Aspen, CNM

## 2018-11-15 NOTE — Patient Instructions (Signed)

## 2018-12-07 ENCOUNTER — Other Ambulatory Visit: Payer: BLUE CROSS/BLUE SHIELD

## 2019-10-18 ENCOUNTER — Encounter: Payer: BLUE CROSS/BLUE SHIELD | Admitting: Certified Nurse Midwife

## 2019-11-16 ENCOUNTER — Other Ambulatory Visit: Payer: Self-pay

## 2019-11-16 ENCOUNTER — Encounter: Payer: Medicaid Other | Admitting: Certified Nurse Midwife

## 2020-03-06 ENCOUNTER — Other Ambulatory Visit: Payer: Self-pay

## 2020-03-06 ENCOUNTER — Ambulatory Visit
Admission: EM | Admit: 2020-03-06 | Discharge: 2020-03-06 | Disposition: A | Discharge status: Home / Self Care | Payer: BLUE CROSS/BLUE SHIELD | Attending: Emergency Medicine | Admitting: Emergency Medicine

## 2020-03-06 DIAGNOSIS — J0100 Acute maxillary sinusitis, unspecified: Secondary | ICD-10-CM | POA: Diagnosis not present

## 2020-03-06 DIAGNOSIS — Z1152 Encounter for screening for COVID-19: Secondary | ICD-10-CM | POA: Diagnosis not present

## 2020-03-06 MED ORDER — AMOXICILLIN 875 MG PO TABS: 875.0000 mg | ORAL_TABLET | Freq: Two times a day (BID) | ORAL | 0 refills | Status: AC

## 2020-03-06 MED ORDER — BENZONATATE 100 MG PO CAPS: 100.0000 mg | ORAL_CAPSULE | Freq: Three times a day (TID) | ORAL | 0 refills | Status: DC

## 2020-03-06 NOTE — Discharge Instructions (Signed)
Take the amoxicillin and Tessalon Perles as directed.    Follow up with your primary care provider if your symptoms are not improving.    

## 2020-03-06 NOTE — ED Provider Notes (Signed)
Renaldo Fiddler    CSN: [id number] Arrival date & time: 03/06/20  5329      History   Chief Complaint Chief Complaint  Patient presents with  . Cough    HPI Deanna Hutchinson is a 39 y.o. female.  Patient presents with 1 week history of cough productive of yellow phlegm, nasal congestion, sinus pressure, runny nose. She denies fever, chills, sore throat, shortness of breath, vomiting, diarrhea, or other symptoms. OTC treatment attempted at home.  The history is provided by the patient and medical records.    Past Medical History:  Diagnosis Date  . Vaginal Pap smear, abnormal     Patient Active Problem List   Diagnosis Date Noted  . Labor and delivery, indication for care 08/31/2018  . Group beta Strep positive 08/09/2018  . Abnormal maternal serum screening test   . Advanced maternal age in multigravida, second trimester   . Encounter for supervision of normal first pregnancy in first trimester 02/22/2018  . 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 BREAST ENHANCEMENT SURGERY Bilateral   . BREAST SURGERY    . NOSE SURGERY      OB History    Gravida  3   Para  3   Term  3   Preterm  0   AB  0   Living  3     SAB  0   TAB  0   Ectopic  0   Multiple  0   Live Births  3            Home Medications    Prior to Admission medications   Medication Sig Start Date End Date Taking? Authorizing Provider  acetaminophen (TYLENOL) 325 MG tablet Take 2 tablets (650 mg total) by mouth every 4 (four) hours as needed (for pain scale < 4ORtemperature>/=100.5 F). 09/01/18   Doreene Burke, CNM  amoxicillin (AMOXIL) 875 MG tablet Take 1 tablet (875 mg total) by mouth 2 (two) times daily for 7 days. 03/06/20 03/13/20  Mickie Bail, NP  benzonatate (TESSALON) 100 MG capsule Take 1 capsule (100 mg total) by mouth every 8 (eight) hours. 03/06/20   Mickie Bail, NP  levonorgestrel (MIRENA) 20 MCG/24HR IUD 1 each by  Intrauterine route once.    [provider]    Family History Family History  Problem Relation Age of Onset  . Diabetes Mother   . Heart disease Mother     Social History Social History   Tobacco Use  . Smoking status: Never Smoker  . Smokeless tobacco: Never Used  Substance Use Topics  . Alcohol use: No  . Drug use: No     Allergies   Patient has no known allergies.   Review of Systems Review of Systems  Constitutional: Negative for chills and fever.  HENT: Positive for congestion, postnasal drip, rhinorrhea and sinus pressure. Negative for ear pain and sore throat.   Eyes: Negative for pain and visual disturbance.  Respiratory: Positive for cough. Negative for shortness of breath.   Cardiovascular: Negative for chest pain and palpitations.  Gastrointestinal: Negative for abdominal pain, diarrhea and vomiting.  Genitourinary: Negative for dysuria and hematuria.  Musculoskeletal: Negative for arthralgias and back pain.  Skin: Negative for color change and rash.  Neurological: Negative for seizures and syncope.  All other systems reviewed and are negative.    Physical Exam Triage Vital Signs ED Triage Vitals  Enc Vitals Group  BP      Pulse      Resp      Temp      Temp src      SpO2      Weight      Height      Head Circumference      Peak Flow      Pain Score      Pain Loc      Pain Edu?      Excl. in GC?    No data found.  Updated Vital Signs BP 106/70   Pulse 76   Temp 99 F (37.2 C) (Oral)   Resp 16   Ht 5\' 3"  (1.6 m)   Wt 136 lb (61.7 kg)   SpO2 96%   BMI 24.09 kg/m   Visual Acuity Right Eye Distance:   Left Eye Distance:   Bilateral Distance:    Right Eye Near:   Left Eye Near:    Bilateral Near:     Physical Exam Vitals and nursing note reviewed.  Constitutional:      General: She is not in acute distress.    Appearance: She is well-developed. She is not ill-appearing.  HENT:     Head: Normocephalic and  atraumatic.     Right Ear: Tympanic membrane normal.     Left Ear: Tympanic membrane normal.     Nose: Congestion and rhinorrhea present.     Mouth/Throat:     Mouth: Mucous membranes are moist.     Pharynx: Oropharynx is clear.  Eyes:     Conjunctiva/sclera: Conjunctivae normal.  Cardiovascular:     Rate and Rhythm: Normal rate and regular rhythm.     Heart sounds: No murmur heard.   Pulmonary:     Effort: Pulmonary effort is normal. No respiratory distress.     Breath sounds: Normal breath sounds.  Abdominal:     Palpations: Abdomen is soft.     Tenderness: There is no abdominal tenderness. There is no guarding or rebound.  Musculoskeletal:     Cervical back: Neck supple.  Skin:    General: Skin is warm and dry.     Findings: No rash.  Neurological:     General: No focal deficit present.     Mental Status: She is alert and oriented to person, place, and time.     Gait: Gait normal.  Psychiatric:        Mood and Affect: Mood normal.        Behavior: Behavior normal.      UC Treatments / Results  Labs (all labs ordered are listed, but only abnormal results are displayed) Labs Reviewed  NOVEL CORONAVIRUS, NAA    EKG   Radiology No results found.  Procedures Procedures (including critical care time)  Medications Ordered in UC Medications - No data to display  Initial Impression / Assessment and Plan / UC Course  I have reviewed the triage vital signs and the nursing notes.  Pertinent labs & imaging results that were available during my care of the patient were reviewed by me and considered in my medical decision making (see chart for details).   Acute sinusitis. Treating with Tessalon Perles and amoxicillin. PCR COVID pending.  Instructed patient to self quarantine until the test result is back.  Discussed symptomatic treatment including Tylenol, rest, hydration.  Instructed patient to follow up with her PCP if her symptoms are not improving.  Patient agrees  to plan of care.  Final Clinical Impressions(s) / UC Diagnoses   Final diagnoses:  Encounter for screening for COVID-19  Acute non-recurrent maxillary sinusitis     Discharge Instructions     Take the amoxicillin and Tessalon Perles as directed.    Follow up with your primary care provider if your symptoms are not improving.       ED Prescriptions    Medication Sig Dispense Auth. Provider   benzonatate (TESSALON) 100 MG capsule Take 1 capsule (100 mg total) by mouth every 8 (eight) hours. 21 capsule Mickie Bail, NP   amoxicillin (AMOXIL) 875 MG tablet Take 1 tablet (875 mg total) by mouth 2 (two) times daily for 7 days. 14 tablet Mickie Bail, NP     PDMP not reviewed this encounter.   Mickie Bail, NP 03/06/20 [phone]

## 2020-03-06 NOTE — ED Triage Notes (Signed)
Pt reports having cough, runny nose and congestion x5 days. No known covid contacts. No other symptoms at this time.

## 2020-03-07 LAB — NOVEL CORONAVIRUS, NAA: SARS-CoV-2, NAA: NOT DETECTED

## 2020-03-07 LAB — SARS-COV-2, NAA 2 DAY TAT

## 2021-07-29 ENCOUNTER — Ambulatory Visit: Payer: Medicaid Other

## 2022-03-01 NOTE — Progress Notes (Signed)
I,Sha'taria Tyson,acting as a Education administrator for [employer], PA-C.,have documented all relevant documentation on the behalf of Deanna Kirschner, PA-C,as directed by  Deanna Kirschner, PA-C while in the presence of Deanna Kirschner, PA-C.  New patient visit   Patient: Deanna Hutchinson   DOB: Nov 18, 1980   41 y.o. Female  MRN: [id number] Visit Date: 03/02/2022  Today's healthcare provider: [name], PA-C   Chief Complaint  Patient presents with   Deanna Hutchinson is a 41 y.o. female who presents today as a new patient to establish care.  HPI   She reports needing a Pap smear, would like them annually.  She also reports thick, white vaginal discharge x 2 years, but over the last two weeks she has noticed a smell. Denies itching, dysuria, hematuria.   Pt reports increased light sensitivity recently, would like a referral to an eye doctor. She has some difficulty seeing far and near.   Past Medical History:  Diagnosis Date   Vaginal Pap smear, abnormal    Past Surgical History:  Procedure Laterality Date   BREAST ENHANCEMENT SURGERY Bilateral    BREAST SURGERY     NOSE SURGERY     Family Status  Relation Name Status   Mother  Alive   Father  Alive   Family History  Problem Relation Age of Onset   Diabetes Mother    Heart disease Mother    Social History   Socioeconomic History   Marital status: Married    Spouse name: Not on file   Number of children: Not on file   Years of education: Not on file   Highest education level: Not on file  Occupational History   Not on file  Tobacco Use   Smoking status: Never   Smokeless tobacco: Never  Substance and Sexual Activity   Alcohol use: No   Drug use: No   Sexual activity: Yes  Other Topics Concern   Not on file  Social History Narrative   Not on file   Social Determinants of Health   Financial Resource Strain: Low Risk  (03/22/2018)   Overall Financial Resource Strain (CARDIA)    Difficulty of  Paying Living Expenses: Not hard at all  Food Insecurity: Not on file  Transportation Needs: No Transportation Needs (03/22/2018)   PRAPARE - Hydrologist (Medical): No    Lack of Transportation (Non-Medical): No  Physical Activity: Not on file  Stress: Not on file  Social Connections: Not on file   Outpatient Medications Prior to Visit  Medication Sig   levonorgestrel (MIRENA) 20 MCG/24HR IUD 1 each by Intrauterine route once.   [DISCONTINUED] acetaminophen (TYLENOL) 325 MG tablet Take 2 tablets (650 mg total) by mouth every 4 (four) hours as needed (for pain scale < 4  OR  temperature  >/=  100.5 F).   [DISCONTINUED] benzonatate (TESSALON) 100 MG capsule Take 1 capsule (100 mg total) by mouth every 8 (eight) hours.   No facility-administered medications prior to visit.   No Known Allergies  Immunization History  Administered Date(s) Administered   Hepatitis B 09/11/2007, 11/13/2007, 03/28/2008   Influenza,inj,Quad PF,6+ Mos 03/22/2018   Tdap 10/01/2014, 06/12/2018    Health Maintenance  Topic Date Due   COVID-19 Vaccine (1) Never done   MAMMOGRAM  Never done   Hepatitis C Screening  Never done   PAP SMEAR-Modifier  02/22/2021   INFLUENZA VACCINE  08/08/2022 (Originally 12/08/2021)  TETANUS/TDAP  06/12/2028   HIV Screening  Completed   HPV VACCINES  Aged Out    Patient Care Team: Deanna Kirschner, PA-C as PCP - General (Physician Assistant)  Review of Systems  Constitutional:  Negative for fatigue and fever.  Respiratory:  Negative for cough and shortness of breath.   Cardiovascular:  Negative for chest pain and leg swelling.  Gastrointestinal:  Negative for abdominal pain.  Genitourinary:  Positive for vaginal discharge.  Neurological:  Negative for dizziness and headaches.      Objective    BP 114/72 (BP Location: Right Arm, Patient Position: Sitting, Cuff Size: Normal)   Pulse 62   Temp 98.5 F (36.9 C) (Oral)   Resp 14   Ht 5'  3" (1.6 m)   Wt 145 lb (65.8 kg)   SpO2 100% Comment: room air  BMI 25.69 kg/m   Physical Exam Constitutional:      General: She is awake.     Appearance: She is well-developed. She is not ill-appearing.  HENT:     Head: Normocephalic.     Right Ear: Tympanic membrane normal.     Left Ear: Tympanic membrane normal.     Nose: Nose normal. No congestion or rhinorrhea.     Mouth/Throat:     Pharynx: No oropharyngeal exudate or posterior oropharyngeal erythema.  Eyes:     Conjunctiva/sclera: Conjunctivae normal.     Pupils: Pupils are equal, round, and reactive to light.  Neck:     Thyroid: No thyroid mass or thyromegaly.  Cardiovascular:     Rate and Rhythm: Normal rate and regular rhythm.     Heart sounds: Normal heart sounds.  Pulmonary:     Effort: Pulmonary effort is normal.     Breath sounds: Normal breath sounds.  Chest:     Comments: B/l breasts w/o nipple inversion, discharge, no masses or skin changes appreciated.  B/l breast implants present. Genitourinary:    Labia:        Right: No rash.        Left: No rash.      Cervix: Friability present.     Comments: Cervix appears friable, some bleeding after pap obtained.  Thick, white/grey/yellow discharge appreciated Musculoskeletal:     Right lower leg: No swelling. No edema.     Left lower leg: No swelling. No edema.  Lymphadenopathy:     Cervical: No cervical adenopathy.  Skin:    General: Skin is warm.  Neurological:     Mental Status: She is alert and oriented to person, place, and time.  Psychiatric:        Attention and Perception: Attention normal.        Mood and Affect: Mood normal.        Speech: Speech normal.        Behavior: Behavior normal. Behavior is cooperative.    Depression Screen    03/02/2022   10:49 AM 10/17/2018    8:57 AM  PHQ 2/9 Scores  PHQ - 2 Score 0 0  PHQ- 9 Score  0   No results found for any visits on 03/02/22.  Assessment & Plan      Problem List Items Addressed This  Visit       Other   History of abnormal cervical Pap smear    In 2019, + ascus w/ HPV. Pt had colposcopy done two lesions were seen but no bx performed as pt was pregnant. As far as I can tell, no follow  up done.  As pt is having discharge today, will perform pelvic, get sample (see vag discharge note) and complete pap today. If pap abnormal, will refer to gyn . Pt would prefer to stay here for pelvic exams and have an annual pap smear completed.       Moderate mixed hyperlipidemia not requiring statin therapy    Historically, will repeat labs today       Relevant Orders   CBC w/Diff/Platelet   Comprehensive Metabolic Panel (CMET)   Lipid Profile   Vaginal discharge    Will r/o yeast, bv, trich, g/c UA  + leuk but likely contaminant, no urinary symptoms will forgo culture       Relevant Orders   NuSwab Vaginitis Plus (VG+)   POCT Urinalysis Dipstick   Other Visit Diagnoses     Physical exam, annual    -  Primary   Relevant Orders   CBC w/Diff/Platelet   Comprehensive Metabolic Panel (CMET)   TSH + free T4   HgB A1c   Lipid Profile   Cervical cancer screening       Relevant Orders   Cytology - PAP   Breast cancer screening by mammogram       Relevant Orders   MM 3D SCREEN BREAST W/IMPLANT BILATERAL   Overweight (BMI 25.0-29.9)       Relevant Orders   TSH + free T4   HgB A1c   Light sensitivity       Relevant Orders   Ambulatory referral to Ophthalmology       Return in about 1 year (around 03/03/2023) for CPE.     I, Deanna Kirschner, PA-C have reviewed all documentation for this visit. The documentation on  03/02/2022  for the exam, diagnosis, procedures, and orders are all accurate and complete.  Deanna Kirschner, PA-C [facility] [address] [place], [place], [address] Office: [phone] Fax: [place]

## 2022-03-02 ENCOUNTER — Encounter: Payer: Self-pay | Admitting: Physician Assistant

## 2022-03-02 ENCOUNTER — Other Ambulatory Visit (HOSPITAL_COMMUNITY)
Admission: RE | Admit: 2022-03-02 | Discharge: 2022-03-02 | Disposition: A | Discharge status: Home / Self Care | Payer: Medicaid Other | Source: Ambulatory Visit | Attending: Physician Assistant | Admitting: Physician Assistant

## 2022-03-02 ENCOUNTER — Ambulatory Visit (INDEPENDENT_AMBULATORY_CARE_PROVIDER_SITE_OTHER): Payer: Medicaid Other | Admitting: Physician Assistant

## 2022-03-02 VITALS — BP 114/72 | HR 62 | Temp 98.5°F | Resp 14 | Ht 63.0 in | Wt 145.0 lb

## 2022-03-02 DIAGNOSIS — E663 Overweight: Secondary | ICD-10-CM

## 2022-03-02 DIAGNOSIS — Z1231 Encounter for screening mammogram for malignant neoplasm of breast: Secondary | ICD-10-CM

## 2022-03-02 DIAGNOSIS — R6889 Other general symptoms and signs: Secondary | ICD-10-CM

## 2022-03-02 DIAGNOSIS — Z0000 Encounter for general adult medical examination without abnormal findings: Secondary | ICD-10-CM | POA: Diagnosis not present

## 2022-03-02 DIAGNOSIS — Z124 Encounter for screening for malignant neoplasm of cervix: Secondary | ICD-10-CM | POA: Insufficient documentation

## 2022-03-02 DIAGNOSIS — Z8742 Personal history of other diseases of the female genital tract: Secondary | ICD-10-CM

## 2022-03-02 DIAGNOSIS — N898 Other specified noninflammatory disorders of vagina: Secondary | ICD-10-CM | POA: Diagnosis not present

## 2022-03-02 DIAGNOSIS — E782 Mixed hyperlipidemia: Secondary | ICD-10-CM | POA: Diagnosis not present

## 2022-03-02 LAB — POCT URINALYSIS DIPSTICK
Bilirubin, UA: NEGATIVE
Blood, UA: NEGATIVE
Glucose, UA: NEGATIVE
Ketones, UA: NEGATIVE
Nitrite, UA: NEGATIVE
Protein, UA: NEGATIVE
Spec Grav, UA: 1.01 (ref 1.010–1.025)
Urobilinogen, UA: 0.2 E.U./dL
pH, UA: 6.5 (ref 5.0–8.0)

## 2022-03-02 NOTE — Assessment & Plan Note (Addendum)
In 2019, + ascus w/ HPV. Pt had colposcopy done two lesions were seen but no bx performed as pt was pregnant. As far as I can tell, no follow up done.  As pt is having discharge today, will perform pelvic, get sample (see vag discharge note) and complete pap today. If pap abnormal, will refer to gyn . Pt would prefer to stay here for pelvic exams and have an annual pap smear completed.

## 2022-03-02 NOTE — Assessment & Plan Note (Signed)
Historically, will repeat labs today

## 2022-03-02 NOTE — Assessment & Plan Note (Addendum)
Will r/o yeast, bv, trich, g/c UA  + leuk but likely contaminant, no urinary symptoms will forgo culture

## 2022-03-03 LAB — CBC WITH DIFFERENTIAL/PLATELET
Basophils Absolute: 0 10*3/uL (ref 0.0–0.2)
Basos: 0 %
EOS (ABSOLUTE): 0.1 10*3/uL (ref 0.0–0.4)
Eos: 1 %
Hematocrit: 35.2 % (ref 34.0–46.6)
Hemoglobin: 11.4 g/dL (ref 11.1–15.9)
Immature Grans (Abs): 0 10*3/uL (ref 0.0–0.1)
Immature Granulocytes: 0 %
Lymphocytes Absolute: 1.5 10*3/uL (ref 0.7–3.1)
Lymphs: 28 %
MCH: 28 pg (ref 26.6–33.0)
MCHC: 32.4 g/dL (ref 31.5–35.7)
MCV: 87 fL (ref 79–97)
Monocytes Absolute: 0.3 10*3/uL (ref 0.1–0.9)
Monocytes: 6 %
Neutrophils Absolute: 3.5 10*3/uL (ref 1.4–7.0)
Neutrophils: 65 %
Platelets: 240 10*3/uL (ref 150–450)
RBC: 4.07 x10E6/uL (ref 3.77–5.28)
RDW: 12.5 % (ref 11.7–15.4)
WBC: 5.4 10*3/uL (ref 3.4–10.8)

## 2022-03-03 LAB — COMPREHENSIVE METABOLIC PANEL
ALT: 13 IU/L (ref 0–32)
AST: 16 IU/L (ref 0–40)
Albumin/Globulin Ratio: 1.6 (ref 1.2–2.2)
Albumin: 4.5 g/dL (ref 3.9–4.9)
Alkaline Phosphatase: 79 IU/L (ref 44–121)
BUN/Creatinine Ratio: 19 (ref 9–23)
BUN: 14 mg/dL (ref 6–24)
Bilirubin Total: 0.3 mg/dL (ref 0.0–1.2)
CO2: 21 mmol/L (ref 20–29)
Calcium: 9.4 mg/dL (ref 8.7–10.2)
Chloride: 99 mmol/L (ref 96–106)
Creatinine, Ser: 0.73 mg/dL (ref 0.57–1.00)
Globulin, Total: 2.8 g/dL (ref 1.5–4.5)
Glucose: 89 mg/dL (ref 70–99)
Potassium: 4.2 mmol/L (ref 3.5–5.2)
Sodium: 137 mmol/L (ref 134–144)
Total Protein: 7.3 g/dL (ref 6.0–8.5)
eGFR: 105 mL/min/{1.73_m2} (ref >59)

## 2022-03-03 LAB — LIPID PANEL
Chol/HDL Ratio: 2.7 ratio (ref 0.0–4.4)
Cholesterol, Total: 231 mg/dL — ABNORMAL HIGH (ref 100–199)
HDL: 85 mg/dL (ref >39)
LDL Chol Calc (NIH): 139 mg/dL — ABNORMAL HIGH (ref 0–99)
Triglycerides: 42 mg/dL (ref 0–149)
VLDL Cholesterol Cal: 7 mg/dL (ref 5–40)

## 2022-03-03 LAB — TSH+FREE T4
Free T4: 1.22 ng/dL (ref 0.82–1.77)
TSH: 1.35 u[IU]/mL (ref 0.450–4.500)

## 2022-03-03 LAB — HEMOGLOBIN A1C
Est. average glucose Bld gHb Est-mCnc: 128 mg/dL
Hgb A1c MFr Bld: 6.1 % — ABNORMAL HIGH (ref 4.8–5.6)

## 2022-03-04 ENCOUNTER — Telehealth: Payer: Self-pay | Admitting: *Deleted

## 2022-03-04 NOTE — Progress Notes (Signed)
Spoke with patient. She asked that I contact her husband so that he can better explain to her as his english is a little better. Verified contact info and advised I would give him a call

## 2022-03-04 NOTE — Telephone Encounter (Signed)
Patient's husband ,Kevin(DPR) , is returning call for lab results. Results reviewed and questions answered. He is concerned about lab results because patient lives healthy lifestyle- a follow up appointment was made for 6 months to follow up.

## 2022-03-05 ENCOUNTER — Other Ambulatory Visit: Payer: Self-pay | Admitting: Physician Assistant

## 2022-03-05 DIAGNOSIS — B9689 Other specified bacterial agents as the cause of diseases classified elsewhere: Secondary | ICD-10-CM

## 2022-03-05 LAB — NUSWAB VAGINITIS PLUS (VG+)
Atopobium vaginae: HIGH Score — AB
BVAB 2: HIGH Score — AB
Candida albicans, NAA: NEGATIVE
Candida glabrata, NAA: NEGATIVE
Chlamydia trachomatis, NAA: NEGATIVE
Megasphaera 1: HIGH Score — AB
Neisseria gonorrhoeae, NAA: NEGATIVE
Trich vag by NAA: NEGATIVE

## 2022-03-05 MED ORDER — METRONIDAZOLE 500 MG PO TABS: 500.0000 mg | ORAL_TABLET | Freq: Two times a day (BID) | ORAL | 0 refills | Status: DC

## 2022-03-10 LAB — CYTOLOGY - PAP
Comment: NEGATIVE
Diagnosis: UNDETERMINED — AB
High risk HPV: NEGATIVE

## 2022-03-29 DIAGNOSIS — H524 Presbyopia: Secondary | ICD-10-CM | POA: Diagnosis not present

## 2022-03-30 DIAGNOSIS — H5213 Myopia, bilateral: Secondary | ICD-10-CM | POA: Diagnosis not present

## 2022-05-18 DIAGNOSIS — H524 Presbyopia: Secondary | ICD-10-CM | POA: Diagnosis not present

## 2022-06-22 ENCOUNTER — Ambulatory Visit (INDEPENDENT_AMBULATORY_CARE_PROVIDER_SITE_OTHER): Payer: Medicaid Other | Admitting: Physician Assistant

## 2022-06-22 ENCOUNTER — Encounter: Payer: Self-pay | Admitting: Physician Assistant

## 2022-06-22 VITALS — BP 95/66 | HR 85 | Temp 98.0°F | Ht 63.0 in | Wt 150.0 lb

## 2022-06-22 DIAGNOSIS — N898 Other specified noninflammatory disorders of vagina: Secondary | ICD-10-CM | POA: Diagnosis not present

## 2022-06-22 NOTE — Progress Notes (Signed)
      Established patient visit   Patient: Deanna Hutchinson   DOB: 17-Sep-1980   42 y.o. Female  MRN: [id number] Visit Date: 06/22/2022  Today's healthcare provider: [name], PA-C   Cc. Vaginal itching, burning  Subjective    HPI  Pt reports vaginal burning, itching, and a fishy smell x 2 weeks. She reports pain with sex yesterday. Reports even for the last month a fishy odor and clear/white thick discharge. Reports usually going to the gym in the morning and not changing her clothes until after work . Denies a regular cycle, she has an IUD  Medications: Outpatient Medications Prior to Visit  Medication Sig   levonorgestrel (MIRENA) 20 MCG/24HR IUD 1 each by Intrauterine route once.   No facility-administered medications prior to visit.    Review of Systems  Constitutional:  Negative for fatigue and fever.  Respiratory:  Negative for cough and shortness of breath.   Cardiovascular:  Negative for chest pain and leg swelling.  Gastrointestinal:  Negative for abdominal pain.  Genitourinary:  Positive for dyspareunia and vaginal discharge.  Neurological:  Negative for dizziness and headaches.      Objective    BP 95/66 (BP Location: Left Arm, Patient Position: Sitting, Cuff Size: Normal)   Pulse 85   Temp 98 F (36.7 C)   Ht 5\' 3"  (1.6 m)   Wt 150 lb (68 kg)   BMI 26.57 kg/m   Physical Exam Vitals reviewed.  Constitutional:      Appearance: She is not ill-appearing.  HENT:     Head: Normocephalic.  Eyes:     Conjunctiva/sclera: Conjunctivae normal.  Cardiovascular:     Rate and Rhythm: Normal rate.  Pulmonary:     Effort: Pulmonary effort is normal. No respiratory distress.  Genitourinary:    Labia:        Right: No rash or lesion.        Left: No rash or lesion.   Neurological:     General: No focal deficit present.     Mental Status: She is alert and oriented to person, place, and time.  Psychiatric:        Mood and Affect: Mood normal.        Behavior:  Behavior normal.      No results found for any visits on 06/22/22.  Assessment & Plan     Vaginal discharge Swab was taken no pelvic exam was performed Will treat based on results, r/o bv, yeast, trich, g/c Advised pt to f/u in 2 weeks after this round of treatment to ensure problem resolves Educated on vaginal care and infection prevention  Return if symptoms worsen or fail to improve.     I, [name], PA-C have reviewed all documentation for this visit. The documentation on  06/22/22 for the exam, diagnosis, procedures, and orders are all accurate and complete.  [name], PA-C [facility] [address] [place], [place], [address] Office: [phone] Fax: [place]

## 2022-06-25 ENCOUNTER — Other Ambulatory Visit: Payer: Self-pay | Admitting: Physician Assistant

## 2022-06-25 DIAGNOSIS — B9689 Other specified bacterial agents as the cause of diseases classified elsewhere: Secondary | ICD-10-CM

## 2022-06-25 LAB — NUSWAB VAGINITIS PLUS (VG+)
BVAB 2: HIGH Score — AB
Candida albicans, NAA: NEGATIVE
Candida glabrata, NAA: NEGATIVE
Chlamydia trachomatis, NAA: NEGATIVE
Megasphaera 1: HIGH Score — AB
Neisseria gonorrhoeae, NAA: NEGATIVE
Trich vag by NAA: NEGATIVE

## 2022-06-25 MED ORDER — METRONIDAZOLE 500 MG PO TABS: 500.0000 mg | ORAL_TABLET | Freq: Two times a day (BID) | ORAL | 0 refills | Status: AC

## 2022-07-20 NOTE — Progress Notes (Unsigned)
     I,Sha'taria Tyson,acting as a Education administrator for [employer], PA-C.,have documented all relevant documentation on the behalf of [name], PA-C,as directed by  [name], PA-C while in the presence of [name], PA-C.   Established patient visit   Patient: Deanna Hutchinson   DOB: 1981/01/04   42 y.o. Female  MRN: [id number] Visit Date: 07/21/2022  Today's healthcare provider: [name], PA-C   No chief complaint on file.  Subjective    HPI  Follow up for vaginal discharge  The patient was last seen for this 2 weeks ago. Changes made at last visit include sending in antibiotic (flagyl 500 mg tab) and advised do not drink on this medication.  She reports {excellent/good/fair/poor:19665} compliance with treatment. She feels that condition is {improved/worse/unchanged:3041574}. She {is/is not:21021397} having side effects. ***  -----------------------------------------------------------------------------------------   Medications: Outpatient Medications Prior to Visit  Medication Sig   levonorgestrel (MIRENA) 20 MCG/24HR IUD 1 each by Intrauterine route once.   No facility-administered medications prior to visit.    Review of Systems  {Labs  Heme  Chem  Endocrine  Serology  Results Review (optional):23779}   Objective    There were no vitals taken for this visit. {Show previous vital signs (optional):23777}  Physical Exam  ***  No results found for any visits on 07/21/22.  Assessment & Plan     ***  No follow-ups on file.      {provider attestation***:1}   [name], PA-C  [place] [phone] (phone) [phone] (fax)  [place]

## 2022-07-21 ENCOUNTER — Ambulatory Visit (INDEPENDENT_AMBULATORY_CARE_PROVIDER_SITE_OTHER): Payer: Medicaid Other | Admitting: Physician Assistant

## 2022-07-21 ENCOUNTER — Other Ambulatory Visit (HOSPITAL_COMMUNITY)
Admission: RE | Admit: 2022-07-21 | Discharge: 2022-07-21 | Disposition: A | Discharge status: Home / Self Care | Payer: Medicaid Other | Source: Ambulatory Visit | Attending: Physician Assistant | Admitting: Physician Assistant

## 2022-07-21 ENCOUNTER — Encounter: Payer: Self-pay | Admitting: Physician Assistant

## 2022-07-21 VITALS — BP 109/81 | HR 71 | Temp 98.2°F | Wt 148.0 lb

## 2022-07-21 DIAGNOSIS — B9689 Other specified bacterial agents as the cause of diseases classified elsewhere: Secondary | ICD-10-CM

## 2022-07-21 DIAGNOSIS — N760 Acute vaginitis: Secondary | ICD-10-CM | POA: Diagnosis not present

## 2022-07-22 ENCOUNTER — Other Ambulatory Visit: Payer: Self-pay | Admitting: Physician Assistant

## 2022-07-22 DIAGNOSIS — N760 Acute vaginitis: Secondary | ICD-10-CM

## 2022-07-22 LAB — CERVICOVAGINAL ANCILLARY ONLY
Bacterial Vaginitis (gardnerella): POSITIVE — AB
Candida Glabrata: NEGATIVE
Candida Vaginitis: NEGATIVE
Chlamydia: NEGATIVE
Comment: NEGATIVE
Comment: NEGATIVE
Comment: NEGATIVE
Comment: NEGATIVE
Comment: NEGATIVE
Comment: NORMAL
Neisseria Gonorrhea: NEGATIVE
Trichomonas: NEGATIVE

## 2022-07-22 MED ORDER — CLINDAMYCIN PHOSPHATE 2 % VA CREA: 1.0000 | TOPICAL_CREAM | Freq: Every day | VAGINAL | 0 refills | Status: AC

## 2022-07-23 ENCOUNTER — Telehealth: Payer: Self-pay | Admitting: Physician Assistant

## 2022-07-23 NOTE — Telephone Encounter (Signed)
[facility] pharmacy requesting prior authorization  [place]: [place] Name: Deanna Hutchinson Clindamycin Phosphate 2% Cream

## 2022-07-28 NOTE — Telephone Encounter (Signed)
Completed pa, pending

## 2022-07-30 ENCOUNTER — Other Ambulatory Visit: Payer: Self-pay | Admitting: Physician Assistant

## 2022-07-30 DIAGNOSIS — B9689 Other specified bacterial agents as the cause of diseases classified elsewhere: Secondary | ICD-10-CM

## 2022-07-30 DIAGNOSIS — N760 Acute vaginitis: Secondary | ICD-10-CM

## 2022-07-30 MED ORDER — CLEOCIN 100 MG VA SUPP: 100.0000 mg | Freq: Every day | VAGINAL | 0 refills | Status: AC

## 2022-07-30 NOTE — Telephone Encounter (Signed)
Denied. Sending an alternative

## 2022-08-04 ENCOUNTER — Other Ambulatory Visit: Payer: Self-pay | Admitting: Physician Assistant

## 2022-08-04 DIAGNOSIS — Z1231 Encounter for screening mammogram for malignant neoplasm of breast: Secondary | ICD-10-CM

## 2022-08-17 NOTE — Progress Notes (Signed)
Established patient visit   Patient: Deanna Hutchinson   DOB: 1981/01/01   42 y.o. Female  MRN: [id number] Visit Date: 08/18/2022  Today's healthcare provider: Alfredia Ferguson, PA-C   CC: vaginal itching and discharge x 3 mo and cough x 6 mo  Subjective    Vaginal Itching   After completing her recent treatment for BV, pt continue to complain on thick, white vaginal discharge. Per chart review,  When she established her care with her PCP on 03/02/22, she reports having thick, white vaginal discharge x 2 years with noticeable smell for two weeks. NuSwab Vaginits showed BV, was treated with Flagyl In 2019, she was +ascus w/HPV with two lesions on colposcopy, no bx performed. On 06/22/22, pt was seen for vaginal discharge . Was positive for BV, was treated with Flagyl On 07/21/22, pt was seen for BV resolution, was advised to take probiotic daily and treatment for BV, was treated with Clindamycin suppositories  Pt has an IUD x 3 years  Medications: Outpatient Medications Prior to Visit  Medication Sig   levonorgestrel (MIRENA) 20 MCG/24HR IUD 1 each by Intrauterine route once.   No facility-administered medications prior to visit.    Review of Systems  All other systems reviewed and are negative. Except see HPI      Objective    There were no vitals taken for this visit.   Physical Exam Vitals reviewed.  Constitutional:      General: She is not in acute distress.    Appearance: Normal appearance. She is well-developed. She is not diaphoretic.  HENT:     Head: Normocephalic and atraumatic.     Right Ear: Tympanic membrane, ear canal and external ear normal.     Left Ear: Tympanic membrane, ear canal and external ear normal.     Nose: Congestion and rhinorrhea present.     Mouth/Throat:     Pharynx: No posterior oropharyngeal erythema.     Comments: Postnasal drainage Eyes:     General: No scleral icterus.    Conjunctiva/sclera: Conjunctivae normal.  Neck:     Thyroid:  No thyromegaly.  Cardiovascular:     Rate and Rhythm: Normal rate and regular rhythm.     Pulses: Normal pulses.     Heart sounds: Normal heart sounds. No murmur heard. Pulmonary:     Effort: Pulmonary effort is normal. No respiratory distress.     Breath sounds: Normal breath sounds. No stridor. No wheezing, rhonchi or rales.  Musculoskeletal:     Cervical back: Neck supple.     Right lower leg: No edema.     Left lower leg: No edema.  Lymphadenopathy:     Cervical: No cervical adenopathy.  Skin:    General: Skin is warm and dry.     Findings: No rash.  Neurological:     Mental Status: She is alert and oriented to person, place, and time. Mental status is at baseline.  Psychiatric:        Mood and Affect: Mood normal.        Behavior: Behavior normal.      No results found for any visits on 08/18/22.  Assessment & Plan     1. Vaginal discharge Due to recurrent BV, this her fourth time within 7 mo  After completing a course of clindamycin suppositories, she continue to experienced the same symptoms Pt was explained a need for seeing ObGyn to be evaluated, to check for IUD, might need an imaging done  or placed on long-term treatment regimen Advised to continue with personal hygiene and infection control Continue to drink / take probiotics Will reassess   2. Other cough X 6 mo? With nasal congestion, rhinorrhea and postnasal drip Allergic Rhinitis: - Avoidance measures discussed. - Use nasal saline rinses before nose sprays such as with Neilmed Sinus Rinse bottle.  Use distilled water.   - Use Flonase OTC 2 sprays each nostril daily. Aim upward and outward. - Use Zyrtec 10 mg OTC daily.  - Use a humidifier or inhale steam from a shower: Humidifiers can help clear congestion and soothe throat irritation  No follow-ups on file.      The patient was advised to call back or seek an in-person evaluation if the symptoms worsen or if the condition fails to improve as  anticipated.  I discussed the assessment and treatment plan with the patient. The patient was provided an opportunity to ask questions and all were answered. The patient agreed with the plan and demonstrated an understanding of the instructions.  I, Debera Lat, PA-C have reviewed all documentation for this visit. The documentation on 08/18/22 for the exam, diagnosis, procedures, and orders are all accurate and complete.  Debera Lat, [facility], MMS [facility] [phone] (phone) [phone] (fax)   [facility]

## 2022-08-18 ENCOUNTER — Other Ambulatory Visit: Payer: Self-pay | Admitting: Physician Assistant

## 2022-08-18 ENCOUNTER — Ambulatory Visit (INDEPENDENT_AMBULATORY_CARE_PROVIDER_SITE_OTHER): Payer: Medicaid Other | Admitting: Physician Assistant

## 2022-08-18 ENCOUNTER — Encounter: Payer: Self-pay | Admitting: Physician Assistant

## 2022-08-18 ENCOUNTER — Encounter: Payer: Self-pay | Admitting: Licensed Practical Nurse

## 2022-08-18 ENCOUNTER — Telehealth: Payer: Self-pay | Admitting: Physician Assistant

## 2022-08-18 VITALS — BP 107/88 | HR 70 | Ht 63.0 in | Wt 153.0 lb

## 2022-08-18 DIAGNOSIS — R058 Other specified cough: Secondary | ICD-10-CM | POA: Diagnosis not present

## 2022-08-18 DIAGNOSIS — B9689 Other specified bacterial agents as the cause of diseases classified elsewhere: Secondary | ICD-10-CM

## 2022-08-18 DIAGNOSIS — N760 Acute vaginitis: Secondary | ICD-10-CM | POA: Diagnosis not present

## 2022-08-18 DIAGNOSIS — N898 Other specified noninflammatory disorders of vagina: Secondary | ICD-10-CM

## 2022-08-18 NOTE — Telephone Encounter (Signed)
The patient called in stating she does not have an appt with an ob/gyn until next month. She is wanting to know if she could possibly get a prescription for the issues she was seen for today to last up until her appt time which is on 05/07. She uses   [id number] [place], [place] - [address] Phone: [phone]  Fax: [phone]      She said her insurance did not cover the liquid medicine that was prescribed previously for this issue so could she get something else. Please assist patient further.

## 2022-08-19 NOTE — Telephone Encounter (Signed)
LVMTCB. CRM created. Ok for PEC to advise 

## 2022-08-19 NOTE — Telephone Encounter (Signed)
Pt called in , I gave her the info of the referral being sent somewhere else for a sooner appt. She menitoned again about getting med for her itching until she gets her referral appt.

## 2022-08-23 ENCOUNTER — Other Ambulatory Visit: Payer: Self-pay | Admitting: Physician Assistant

## 2022-08-23 NOTE — Progress Notes (Signed)
Please, let pt know that this her fourth encounter for BV. She was dispensed clindamycin 2% vaginal cream for 30 days on 07/22/22 and her supply should last her until now.

## 2022-08-24 ENCOUNTER — Ambulatory Visit
Admission: RE | Admit: 2022-08-24 | Discharge: 2022-08-24 | Disposition: A | Discharge status: Home / Self Care | Payer: Medicaid Other | Source: Ambulatory Visit | Attending: Physician Assistant | Admitting: Physician Assistant

## 2022-08-24 DIAGNOSIS — Z1231 Encounter for screening mammogram for malignant neoplasm of breast: Secondary | ICD-10-CM | POA: Insufficient documentation

## 2022-08-24 NOTE — Telephone Encounter (Signed)
Ok, will wait for gyn

## 2022-08-25 ENCOUNTER — Ambulatory Visit (INDEPENDENT_AMBULATORY_CARE_PROVIDER_SITE_OTHER): Payer: Medicaid Other | Admitting: Physician Assistant

## 2022-08-25 ENCOUNTER — Encounter: Payer: Self-pay | Admitting: Physician Assistant

## 2022-08-25 ENCOUNTER — Ambulatory Visit
Admission: RE | Admit: 2022-08-25 | Discharge: 2022-08-25 | Disposition: A | Discharge status: Home / Self Care | Payer: Medicaid Other | Source: Ambulatory Visit | Attending: Physician Assistant | Admitting: Physician Assistant

## 2022-08-25 ENCOUNTER — Ambulatory Visit
Admission: RE | Admit: 2022-08-25 | Discharge: 2022-08-25 | Disposition: A | Discharge status: Home / Self Care | Payer: Medicaid Other | Attending: Physician Assistant | Admitting: Physician Assistant

## 2022-08-25 ENCOUNTER — Other Ambulatory Visit: Payer: Self-pay | Admitting: Physician Assistant

## 2022-08-25 VITALS — BP 101/68 | HR 71 | Ht 63.0 in | Wt 131.8 lb

## 2022-08-25 DIAGNOSIS — N6489 Other specified disorders of breast: Secondary | ICD-10-CM

## 2022-08-25 DIAGNOSIS — R079 Chest pain, unspecified: Secondary | ICD-10-CM | POA: Diagnosis not present

## 2022-08-25 DIAGNOSIS — R053 Chronic cough: Secondary | ICD-10-CM

## 2022-08-25 DIAGNOSIS — R059 Cough, unspecified: Secondary | ICD-10-CM | POA: Diagnosis not present

## 2022-08-25 DIAGNOSIS — R928 Other abnormal and inconclusive findings on diagnostic imaging of breast: Secondary | ICD-10-CM

## 2022-08-25 NOTE — Progress Notes (Signed)
I,Sha'taria Tyson,acting as a Neurosurgeon for [employer], PA-C.,have documented all relevant documentation on the behalf of Alfredia Ferguson, PA-C,as directed by  Alfredia Ferguson, PA-C while in the presence of Alfredia Ferguson, PA-C.   Established patient visit   Patient: Deanna Hutchinson   DOB: [birth year]   42 y.o. Female  MRN: [id number] Visit Date: 08/25/2022  Today's healthcare provider: Alfredia Ferguson, PA-C   Cc. cough  Subjective    Cough This is a new problem. The current episode started more than 1 month ago. The problem occurs every few minutes. The cough is Non-productive. Associated symptoms include chest pain and headaches. The symptoms are aggravated by lying down and fumes. She has tried OTC cough suppressant and prescription cough suppressant for the symptoms. The treatment provided no relief.   Pt works at a [employer] and she is concerned these chemicals are causing her cough.   She was seen for cough by Debera Lat, PA-C 08/18/22. Pt was advised to treat her allergic rhinitis.   Medications: Outpatient Medications Prior to Visit  Medication Sig   levonorgestrel (MIRENA) 20 MCG/24HR IUD 1 each by Intrauterine route once.   No facility-administered medications prior to visit.    Review of Systems  Respiratory:  Positive for cough.   Cardiovascular:  Positive for chest pain.  Neurological:  Positive for headaches.     Objective    BP 101/68 (BP Location: Left Arm, Patient Position: Sitting, Cuff Size: Normal)   Pulse 71   Ht  (1.6 m)   Wt 131 lb 12.8 oz (59.8 kg)   SpO2 100%   BMI 23.35 kg/m    Physical Exam Constitutional:      General: She is awake.     Appearance: She is well-developed.  HENT:     Head: Normocephalic.     Nose: Congestion present.     Mouth/Throat:     Mouth: Mucous membranes are moist.     Pharynx: No oropharyngeal exudate or posterior oropharyngeal erythema.  Eyes:     Conjunctiva/sclera: Conjunctivae normal.  Cardiovascular:      Rate and Rhythm: Normal rate and regular rhythm.     Heart sounds: Normal heart sounds.  Pulmonary:     Effort: Pulmonary effort is normal.     Breath sounds: Normal breath sounds. No wheezing, rhonchi or rales.  Skin:    General: Skin is warm.  Neurological:     Mental Status: She is alert and oriented to person, place, and time.  Psychiatric:        Attention and Perception: Attention normal.        Mood and Affect: Mood normal.        Speech: Speech normal.        Behavior: Behavior is cooperative.      No results found for any visits on 08/25/22.  Assessment & Plan     1. Chronic cough Given pt has been concerned 3-4 mo now, will order cxr and refer to pulm for pfts.  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 Encouraged pt to restart - DG Chest 2 View; Future   Return if symptoms worsen or fail to improve.      I, Alfredia Ferguson, PA-C have reviewed all documentation for this visit. The documentation on  08/25/22  for the exam, diagnosis, procedures, and orders are all accurate and complete.  Alfredia Ferguson, PA-C [facility] [address] [place], [place], [address] Office: [phone] Fax: [place]

## 2022-09-01 ENCOUNTER — Telehealth: Payer: Self-pay | Admitting: *Deleted

## 2022-09-01 NOTE — Telephone Encounter (Signed)
  Chief Complaint: Results Symptoms: NA Frequency: NA Pertinent Negatives: Patient denies NA Disposition: ED /[] Urgent Care (no appt availability in office) / Appointment(In office/virtual)/  [facility]/ Home Care/ Refused Recommended Disposition /[] [place] [facility]/  Follow-up with PCP Additional Notes:  Pt called for CXR results, reviewed note from PCP. Pt asks to review with husband, husband called, verbalizes understanding.

## 2022-09-06 NOTE — Progress Notes (Deleted)
      Established patient visit   Patient: Deanna Hutchinson   DOB: 1980/11/08   42 y.o. Female  MRN: [id number] Visit Date: 09/07/2022  Today's healthcare provider: Alfredia Ferguson, PA-C   No chief complaint on file.  Subjective    HPI     Medications: Outpatient Medications Prior to Visit  Medication Sig   levonorgestrel (MIRENA) 20 MCG/24HR IUD 1 each by Intrauterine route once.   No facility-administered medications prior to visit.    Review of Systems  {Labs  Heme  Chem  Endocrine  Serology  Results Review (optional):23779}   Objective    There were no vitals taken for this visit. {Show previous vital signs (optional):23777}  Physical Exam  ***  No results found for any visits on 09/07/22.  Assessment & Plan     ***  No follow-ups on file.      {provider attestation***:1}   Alfredia Ferguson, PA-C  [facility] [phone] (phone) [phone] (fax)  [facility]

## 2022-09-07 ENCOUNTER — Ambulatory Visit: Payer: Medicaid Other | Admitting: Physician Assistant

## 2022-09-08 ENCOUNTER — Ambulatory Visit
Admission: RE | Admit: 2022-09-08 | Discharge: 2022-09-08 | Disposition: A | Discharge status: Home / Self Care | Payer: Medicaid Other | Source: Ambulatory Visit | Attending: Physician Assistant | Admitting: Physician Assistant

## 2022-09-08 DIAGNOSIS — R928 Other abnormal and inconclusive findings on diagnostic imaging of breast: Secondary | ICD-10-CM

## 2022-09-08 DIAGNOSIS — N6489 Other specified disorders of breast: Secondary | ICD-10-CM | POA: Diagnosis not present

## 2022-09-08 DIAGNOSIS — R92321 Mammographic fibroglandular density, right breast: Secondary | ICD-10-CM | POA: Diagnosis not present

## 2022-09-14 ENCOUNTER — Encounter: Payer: Self-pay | Admitting: Licensed Practical Nurse

## 2022-09-14 ENCOUNTER — Ambulatory Visit (INDEPENDENT_AMBULATORY_CARE_PROVIDER_SITE_OTHER): Payer: Medicaid Other | Admitting: Licensed Practical Nurse

## 2022-09-14 VITALS — BP 99/77 | HR 71 | Ht 63.0 in | Wt 151.4 lb

## 2022-09-14 DIAGNOSIS — N898 Other specified noninflammatory disorders of vagina: Secondary | ICD-10-CM

## 2022-09-14 DIAGNOSIS — Z113 Encounter for screening for infections with a predominantly sexual mode of transmission: Secondary | ICD-10-CM | POA: Diagnosis not present

## 2022-09-14 NOTE — Progress Notes (Signed)
  HPI:      [name] is a 42 y.o. [id number] who LMP was No LMP recorded. (Menstrual status: IUD)., presents today for a problem visit.  She complains of:  Vaginitis: Patient complains of an abnormal vaginal discharge for 5 to 6 months. Vaginal symptoms include local irritation, odor, and post coital bleeding only happened once.Vulvar symptoms include local irritation.STI Risk: sexually active with 1 female partner Discharge described as: copious and white.Other associated symptoms: vulvar itching.Menstrual pattern.Has a Mirena IUD, doe snot typically get a cycle.   Deanna Hutchinson has seen her PCP, she was treated for BV first with an antibiotic and then a gel (clinda suppositories per note 4/10). Her symptoms would improve but not completely go away with treatment. She does shower  immediately after exercising, she wears a panty liner most days d/t the amount of discharge.   PMHx: She  has a past medical history of Vaginal Pap smear, abnormal. Also,  has a past surgical history that includes Breast surgery; Breast enhancement surgery (Bilateral); and Nose surgery., family history includes Diabetes in her mother; Heart disease in her mother.,  reports that she has never smoked. She has never used smokeless tobacco. She reports that she does not drink alcohol and does not use drugs.  She has a current medication list which includes the following prescription(s): levonorgestrel. Also, has No Known Allergies.  ROS see HPI   Objective: BP 99/77   Pulse 71   Ht 5\' 3"  (1.6 m)   Wt 151 lb 6.4 oz (68.7 kg)   BMI 26.82 kg/m  Physical Exam Constitutional:      Appearance: Normal appearance.  Genitourinary:     Vulva normal.     Genitourinary Comments: Spec exam: cervix pink, 2 small cysts noted at 11 O'clock, small to moderate amount of green discharge present, IUD strings visible, no bleeding or obvious odor   Neurological:     General: No focal deficit present.     Mental Status: She is alert.   Psychiatric:        Mood and Affect: Mood normal.    ASSESSMENT/PLAN:    Problem List Items Addressed This Visit   None Visit Diagnoses     Vaginal itching    -  Primary   Relevant Orders   NuSwab Vaginitis Plus (VG+)   Vaginal odor       Relevant Orders   NuSwab Vaginitis Plus (VG+)   Screening examination for venereal disease       Relevant Orders   NuSwab Vaginitis Plus (VG+)      Discussed this may be chronic BV, we should swab for sexually transmitted infection to be on the safe side-pt agreeable, she would like expedited partner treatment if there is anything present. If BV is present, will treat with Flagyl and then recommend course of Boric Acid and vaginal probiotic.   Carie Caddy, CNM  [facility]  09/14/22  9:54 AM

## 2022-09-15 ENCOUNTER — Telehealth: Payer: Self-pay | Admitting: *Deleted

## 2022-09-15 NOTE — Telephone Encounter (Signed)
Pt husband Caryn Bee on [place] given mammogram results per notes of L. Drubel , PA from 09/08/22 on 09/15/22. Pt husband verbalized understanding.

## 2022-09-17 LAB — NUSWAB VAGINITIS PLUS (VG+)
Atopobium vaginae: HIGH Score — AB
Candida albicans, NAA: NEGATIVE
Candida glabrata, NAA: NEGATIVE
Chlamydia trachomatis, NAA: NEGATIVE
Neisseria gonorrhoeae, NAA: NEGATIVE
Trich vag by NAA: NEGATIVE

## 2022-09-21 ENCOUNTER — Telehealth: Payer: Self-pay | Admitting: Licensed Practical Nurse

## 2022-09-21 NOTE — Telephone Encounter (Signed)
TC to [name] Your swab scored a 2 on the vaginitis panel. It is not clear that you have BV, but with the symptoms that you described at the our visit, you may have have BV. Pt reports for the last 3 days she has not had any vaginal symptoms. Discussed options, treat with flagyl, boric acid or vaginal probiotics.  Given that her symptoms have improved, [name] will try Vaginal probiotic, reviewed she should stay on these for at least 3 months. If her symptoms return, she should be treated with Flagyl. I would also consider a 21 day course of Boric Acid.  Encouraged pt to signup for mychart so that she can easily see her labs results.   Carie Caddy, CNM  [facility]  09/21/2022 11:26 AM

## 2022-09-28 ENCOUNTER — Institutional Professional Consult (permissible substitution): Payer: Medicaid Other | Admitting: Student in an Organized Health Care Education/Training Program

## 2022-10-13 ENCOUNTER — Ambulatory Visit: Payer: Medicaid Other | Admitting: Student in an Organized Health Care Education/Training Program

## 2022-10-13 ENCOUNTER — Encounter: Payer: Self-pay | Admitting: Student in an Organized Health Care Education/Training Program

## 2022-10-13 VITALS — BP 124/72 | HR 72 | Temp 98.0°F | Ht 63.0 in | Wt 158.0 lb

## 2022-10-13 DIAGNOSIS — R053 Chronic cough: Secondary | ICD-10-CM | POA: Diagnosis not present

## 2022-10-13 MED ORDER — LORATADINE 10 MG PO TABS
10.0000 mg | ORAL_TABLET | Freq: Every day | ORAL | 11 refills | Status: DC
Start: 2022-10-13 — End: 2023-07-06

## 2022-10-13 MED ORDER — FLUTICASONE PROPIONATE 50 MCG/ACT NA SUSP
1.0000 | Freq: Every day | NASAL | 3 refills | Status: DC
Start: 2022-10-13 — End: 2022-11-22

## 2022-10-13 NOTE — Progress Notes (Signed)
Synopsis: Referred in for chronic cough by Deanna Ferguson, PA-C  Assessment & Plan:   1. Chronic cough  Patient is presenting for the chief complaint of chronic cough over the past few months. Physical exam reveals clear lungs, and congested nasal mucosa with polyps. CXR in April was within normal.  Given her history of eczema and nasal polyps, and the fact that she works at a [employer] and is exposed to solvents and smells, work related asthma and cough variant asthma are on the differential. Upper airway cough syndrome is also on the differential given the nasal congestion.  For workup, I will obtain a pulmonary function test (spirometry, lung volumes, DLCO) and follow this up with a methacholine challenge test to assess for asthma. We will also initiate empiric treatment for upper airway cough syndrome with intra-nasal corticosteroids and second generation anti-histamine.  Should workup be non-revealing, will consider expanding it to include a double contrast esophagogram as well as a chest CT (to assess for parenchymal lung disease as well as EDAC/TBM).  Finally, we did discuss best practices to decreased occupational exposure in her line of work. The occupational safety and health administration Teaching laboratory technician) does have information on their website regarding best practices in nail salons which I recommended they reference. Also recommended improving ventilation at work.  - Pulmonary Function Test [facility] Only (spirometry, lung volumes, DLCO, methacholine challenge test) - fluticasone (FLONASE) 50 MCG/ACT nasal spray; Place 1 spray into both nostrils daily.  Dispense: 18.2 mL; Refill: 3 - loratadine (CLARITIN) 10 MG tablet; Take 1 tablet (10 mg total) by mouth daily.  Dispense: 30 tablet; Refill: 11   Return in about 4 weeks (around 11/10/2022).  I spent 60 minutes caring for this patient today, including preparing to see the patient, obtaining a medical history , reviewing a separately obtained  history, performing a medically appropriate examination and/or evaluation, counseling and educating the patient/family/caregiver, ordering medications, tests, or procedures, documenting clinical information in the electronic health record, and independently interpreting results (not separately reported/billed) and communicating results to the patient/family/caregiver  Deanna Chute, MD [facility] 10/13/2022 9:43 AM    End of visit medications:  Meds ordered this encounter  Medications   fluticasone (FLONASE) 50 MCG/ACT nasal spray    Sig: Place 1 spray into both nostrils daily.    Dispense:  18.2 mL    Refill:  3   loratadine (CLARITIN) 10 MG tablet    Sig: Take 1 tablet (10 mg total) by mouth daily.    Dispense:  30 tablet    Refill:  11     Current Outpatient Medications:    fluticasone (FLONASE) 50 MCG/ACT nasal spray, Place 1 spray into both nostrils daily., Disp: 18.2 mL, Rfl: 3   levonorgestrel (MIRENA) 20 MCG/24HR IUD, 1 each by Intrauterine route once., Disp: , Rfl:    loratadine (CLARITIN) 10 MG tablet, Take 1 tablet (10 mg total) by mouth daily., Disp: 30 tablet, Rfl: 11   clotrimazole-betamethasone (LOTRISONE) cream, Apply 1 Application topically., Disp: , Rfl:    Subjective:   PATIENT ID: Deanna Hutchinson GENDER: female DOB: 02/27/1981, MRN: [id number]  Chief Complaint  Patient presents with   pulmonary consult     Dry cough x6-80mo  CXR 08/25/2022    HPI  Deanna Hutchinson is a pleasant 42 year old healthy female presenting to clinic for the evaluation of cough.  Patient reports a chronic cough that she's had most of her life. She feels that the cough  got worse over the past few months, and has been incessant. The cough is not worse at night or during the day, and does not wake her up from sleep. This is a dry and non-productive cough. There are no other associated symptoms. She denies any wheezing, shortness of breath, chest tightness, chest pain, or hemoptysis.  She also does not report any fevers, chills, night sweats, weight loss, systemic symptoms, GI or GU symptoms. She's seen her primary care physician and none of the interventions have been helpful for symptoms. On further questioning, she didn't use the medicines prescribed by PCP regularly, and discontinued them after not noticing relief.   She does report having had a cough since she was a child, but it got worse recently. She never had any respiratory illness, no history of asthma, and has never been on inhalers. She denies any symptoms of reflux or of heart burn, there's no sensation of post nasal drip. Diet includes plenty of vegetables and rice, mostly home cooked. Reports enjoying spicy foods, otherwise no foods that could exacerbate reflux identified. Cough not worse at work vs at home, and not better on her days off.  She does have a history of eczema and reports some nasal polyps for which she has had 2 surgeries (1 [place], 1 here, was told that she might require more).  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  She is a non-smoker, no family history of lung disease or asthma, and no family history of lung cancer.  Ancillary information including prior medications, full medical/surgical/family/social histories, and PFTs (when available) are listed below and have been reviewed.   Review of Systems  Constitutional:  Negative for chills, diaphoresis, fever, malaise/fatigue and weight loss.  Respiratory:  Positive for cough. Negative for hemoptysis, sputum production, shortness of breath and wheezing.   Cardiovascular:  Negative for chest pain, palpitations and leg swelling.  Skin:  Negative for rash.     Objective:   Vitals:   10/13/22 0846  BP: 124/72  Pulse: 72  Temp: 98 F (36.7 C)  TempSrc: Temporal  SpO2: 97%  Weight: 158 lb (71.7 kg)   Height: 5\' 3"  (1.6 m)   97% on RA  BMI Readings from Last 3 Encounters:  10/13/22 27.99 kg/m  09/14/22 26.82 kg/m  08/25/22 23.35 kg/m   Wt Readings from Last 3 Encounters:  10/13/22 158 lb (71.7 kg)  09/14/22 151 lb 6.4 oz (68.7 kg)  08/25/22 131 lb 12.8 oz (59.8 kg)    Physical Exam Constitutional:      General: She is not in acute distress.    Appearance: Normal appearance. She is not ill-appearing.  HENT:     Nose: Congestion present.     Comments: Nasal polyps identified    Mouth/Throat:     Mouth: Mucous membranes are moist.  Cardiovascular:     Rate and Rhythm: Normal rate and regular rhythm.     Pulses: Normal pulses.     Heart sounds: Normal heart sounds.  Pulmonary:     Effort: Pulmonary effort is normal. No respiratory distress.     Breath sounds: Normal breath sounds. No stridor. No wheezing, rhonchi or rales.  Abdominal:     Palpations: Abdomen is soft.  Musculoskeletal:     Right lower leg: No edema.     Left lower  leg: No edema.  Neurological:     General: No focal deficit present.     Mental Status: She is alert and oriented to person, place, and time. Mental status is at baseline.     Ancillary Information    Past Medical History:  Diagnosis Date   Vaginal Pap smear, abnormal      Family History  Problem Relation Age of Onset   Diabetes Mother    Heart disease Mother      Past Surgical History:  Procedure Laterality Date   BREAST ENHANCEMENT SURGERY Bilateral    BREAST SURGERY     NOSE SURGERY      Social History   Socioeconomic History   Marital status: Married    Spouse name: Not on file   Number of children: Not on file   Years of education: Not on file   Highest education level: Not on file  Occupational History   Not on file  Tobacco Use   Smoking status: Never   Smokeless tobacco: Never  Substance and Sexual Activity   Alcohol use: No   Drug use: No   Sexual activity: Yes  Other Topics Concern   Not on file   Social History Narrative   Not on file   Social Determinants of Health   Financial Resource Strain: Low Risk  (03/22/2018)   Overall Financial Resource Strain (CARDIA)    Difficulty of Paying Living Expenses: Not hard at all  Food Insecurity: Not on file  Transportation Needs: No Transportation Needs (03/22/2018)   PRAPARE - Administrator, Civil Service (Medical): No    Lack of Transportation (Non-Medical): No  Physical Activity: Not on file  Stress: Not on file  Social Connections: Not on file  Intimate Partner Violence: Not on file     No Known Allergies   CBC    Component Value Date/Time   WBC 5.4 03/02/2022 1135   WBC 11.0 (H) 09/01/2018 0620   RBC 4.07 03/02/2022 1135   RBC 4.00 09/01/2018 0620   HGB 11.4 03/02/2022 1135   HCT 35.2 03/02/2022 1135   PLT 240 03/02/2022 1135   MCV 87 03/02/2022 1135   MCH 28.0 03/02/2022 1135   MCH 27.3 09/01/2018 0620   MCHC 32.4 03/02/2022 1135   MCHC 32.2 09/01/2018 0620   RDW 12.5 03/02/2022 1135   LYMPHSABS 1.5 03/02/2022 1135   MONOABS 0.4 04/11/2007 2219   EOSABS 0.1 03/02/2022 1135   BASOSABS 0.0 03/02/2022 1135    Pulmonary Functions Testing Results:     No data to display          Outpatient Medications Prior to Visit  Medication Sig Dispense Refill   levonorgestrel (MIRENA) 20 MCG/24HR IUD 1 each by Intrauterine route once.     clotrimazole-betamethasone (LOTRISONE) cream Apply 1 Application topically.     No facility-administered medications prior to visit.

## 2022-11-03 DIAGNOSIS — M545 Low back pain, unspecified: Secondary | ICD-10-CM | POA: Diagnosis not present

## 2022-11-03 DIAGNOSIS — R82998 Other abnormal findings in urine: Secondary | ICD-10-CM | POA: Diagnosis not present

## 2022-11-04 ENCOUNTER — Ambulatory Visit: Payer: Medicaid Other | Attending: Student in an Organized Health Care Education/Training Program

## 2022-11-04 DIAGNOSIS — R053 Chronic cough: Secondary | ICD-10-CM | POA: Diagnosis not present

## 2022-11-04 LAB — PULMONARY FUNCTION TEST ARMC ONLY
DL/VA % pred: 135 %
DL/VA: 5.99 ml/min/mmHg/L
DLCO unc % pred: 130 %
DLCO unc: 27.22 ml/min/mmHg
FEF 25-75 Post: 2.3 L/sec
FEF 25-75 Pre: 1.83 L/sec
FEF2575-%Change-Post: 25 %
FEF2575-%Pred-Post: 77 %
FEF2575-%Pred-Pre: 61 %
FEV1-%Change-Post: 3 %
FEV1-%Pred-Post: 89 %
FEV1-%Pred-Pre: 86 %
FEV1-Post: 2.56 L
FEV1-Pre: 2.47 L
FEV1FVC-%Change-Post: 8 %
FEV1FVC-%Pred-Pre: 92 %
FEV6-%Change-Post: -4 %
FEV6-%Pred-Post: 90 %
FEV6-%Pred-Pre: 94 %
FEV6-Post: 3.13 L
FEV6-Pre: 3.28 L
FEV6FVC-%Pred-Post: 102 %
FEV6FVC-%Pred-Pre: 102 %
FVC-%Change-Post: -4 %
FVC-%Pred-Post: 88 %
FVC-%Pred-Pre: 92 %
FVC-Post: 3.13 L
FVC-Pre: 3.28 L
Post FEV1/FVC ratio: 82 %
Post FEV6/FVC ratio: 100 %
Pre FEV1/FVC ratio: 75 %
Pre FEV6/FVC Ratio: 100 %
RV % pred: 111 %
RV: 1.78 L
TLC % pred: 92 %
TLC: 4.51 L

## 2022-11-04 MED ORDER — ALBUTEROL SULFATE (2.5 MG/3ML) 0.083% IN NEBU
2.5000 mg | INHALATION_SOLUTION | Freq: Once | RESPIRATORY_TRACT | Status: AC
  Administered 2022-11-04: 2.5 mg via RESPIRATORY_TRACT
  Filled 2022-11-04: qty 3

## 2022-11-22 ENCOUNTER — Encounter: Payer: Self-pay | Admitting: Student in an Organized Health Care Education/Training Program

## 2022-11-22 ENCOUNTER — Ambulatory Visit (INDEPENDENT_AMBULATORY_CARE_PROVIDER_SITE_OTHER): Payer: Medicaid Other | Admitting: Student in an Organized Health Care Education/Training Program

## 2022-11-22 DIAGNOSIS — R053 Chronic cough: Secondary | ICD-10-CM | POA: Diagnosis not present

## 2022-11-22 MED ORDER — FLUTICASONE PROPIONATE 50 MCG/ACT NA SUSP
1.0000 | Freq: Two times a day (BID) | NASAL | 6 refills | Status: DC
Start: 2022-11-22 — End: 2023-08-08

## 2022-11-22 NOTE — Progress Notes (Signed)
Assessment & Plan:   1. Chronic cough  Patient is presenting for the chief complaint of chronic cough over the past few months. Physical exam reveals clear lungs, and congested nasal mucosa with polyps. CXR in April was within normal.   Given her history of eczema and nasal polyps, and the fact that she works at a [employer] and is exposed to solvents and smells, work related asthma and cough variant asthma are on the differential. Upper airway cough syndrome is also on the differential given the nasal congestion.  During our initial visit, I ordered PFT's that were fully within normal. The fact that her cough is 75% improved with the intervention of anti-histamine and intra-nasal corticosteroids leads me to believe this is more UACS rather than work related or cough variant asthma. I will hold off on ordering a methacholine challenge test at the moment. I will increase the dose of her intra-nasal corticosteroids to 1 puff twice daily and continue anti-histamine as prescribed and re-assess symptoms at follow up.  Should her symptoms worsen or fail to further improve, I will consider obtaining a methacholine challenge test as well as a double contrast esophagogram to further evaluate the cough.  - fluticasone (FLONASE) 50 MCG/ACT nasal spray; Place 1 spray into both nostrils in the morning and at bedtime.  Dispense: 18.2 mL; Refill: 6 - Continue Claritin 1 pill once daily.  Return in about 2 months (around 01/23/2023).  I spent 30 minutes caring for this patient today, including preparing to see the patient, obtaining a medical history , reviewing a separately obtained history, performing a medically appropriate examination and/or evaluation, counseling and educating the patient/family/caregiver, ordering medications, tests, or procedures, and documenting clinical information in the electronic health record   Deanna Chute, MD [facility] 11/22/2022 8:49 AM    End of  visit medications:  Meds ordered this encounter  Medications   fluticasone (FLONASE) 50 MCG/ACT nasal spray    Sig: Place 1 spray into both nostrils in the morning and at bedtime.    Dispense:  18.2 mL    Refill:  6     Current Outpatient Medications:    clotrimazole-betamethasone (LOTRISONE) cream, Apply 1 Application topically., Disp: , Rfl:    levonorgestrel (MIRENA) 20 MCG/24HR IUD, 1 each by Intrauterine route once., Disp: , Rfl:    loratadine (CLARITIN) 10 MG tablet, Take 1 tablet (10 mg total) by mouth daily., Disp: 30 tablet, Rfl: 11   fluticasone (FLONASE) 50 MCG/ACT nasal spray, Place 1 spray into both nostrils in the morning and at bedtime., Disp: 18.2 mL, Rfl: 6   Subjective:   PATIENT ID: Deanna Hutchinson GENDER: female DOB: August 01, 1980, MRN: [id number]  Chief Complaint  Patient presents with   Follow-up    Cough has improved but is still present- dry cough.     HPI  Deanna Hutchinson is a pleasant 42 year old healthy female presenting to clinic for follow up regarding cough.   During our initial visit, the patient reported a chronic cough that she's had most of her life. She feels that the cough got worse over the past few months, and was incessant. The cough was not worse at night or during the day, and did not wake her up from sleep. It is dry and non-productive cough. There are no other associated symptoms.   During our initial visit in June, I prescribed a second generation anti-histamine as well as intra-nasal corticosteroids. PFT's were ordered and were within normal. She  is here to discuss results and follow up on symptoms. She's overall feeling much better, with about 75% improvement in her cough. The cough persists, though at much less frequency. The cough remains non-productive, and there is no associated wheezing or shortness of breath.   She denies any wheezing, shortness of breath, chest tightness, chest pain, or hemoptysis. She also does not report any fevers, chills, night  sweats, weight loss, systemic symptoms, GI or GU symptoms.   She does report having had a cough since she was a child, but it got worse recently. She never had any respiratory illness, no history of asthma, and has never been on inhalers. She denies any symptoms of reflux or of heart burn, there's no sensation of post nasal drip. Diet includes plenty of vegetables and rice, mostly home cooked. Reports enjoying spicy foods, otherwise no foods that could exacerbate reflux identified. Cough not worse at work vs at home, and not better on her days off.   She does have a history of eczema and reports some nasal polyps for which she has had 2 surgeries (1 [place], 1 here, was told that she might require more).  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  She is a non-smoker, no family history of lung disease or asthma, and no family history of lung cancer.  Ancillary information including prior medications, full medical/surgical/family/social histories, and PFTs (when available) are listed below and have been reviewed.   Review of Systems  Constitutional:  Negative for chills, diaphoresis, fever, malaise/fatigue and weight loss.  Respiratory:  Positive for cough (improved compared to prior). Negative for hemoptysis, sputum production, shortness of breath and wheezing.   Cardiovascular:  Negative for chest pain, palpitations and leg swelling.  Skin:  Negative for rash.     Objective:   Vitals:   11/22/22 0833 11/22/22 0834  BP: 122/74 122/74  Pulse: 80 80  Temp: 97.8 F (36.6 C) 97.8 F (36.6 C)  TempSrc: Temporal Temporal  SpO2: 99% 99%  Weight: 153 lb 9.6 oz (69.7 kg) 153 lb 9.6 oz (69.7 kg)  Height: 5\' 3"  (1.6 m) 5\' 3"  (1.6 m)   99% on RA BMI Readings from Last 3 Encounters:  11/22/22 27.21 kg/m  10/13/22 27.99 kg/m  09/14/22 26.82 kg/m   Wt  Readings from Last 3 Encounters:  11/22/22 153 lb 9.6 oz (69.7 kg)  10/13/22 158 lb (71.7 kg)  09/14/22 151 lb 6.4 oz (68.7 kg)    Physical Exam Constitutional:      General: She is not in acute distress.    Appearance: Normal appearance. She is not ill-appearing.  HENT:     Nose: Congestion present.     Comments: Nasal polyps seen    Mouth/Throat:     Mouth: Mucous membranes are moist.  Cardiovascular:     Rate and Rhythm: Normal rate and regular rhythm.     Pulses: Normal pulses.     Heart sounds: Normal heart sounds.  Pulmonary:     Effort: Pulmonary effort is normal. No respiratory distress.     Breath sounds: Normal breath sounds. No stridor. No wheezing, rhonchi or rales.  Abdominal:     Palpations: Abdomen is soft.  Musculoskeletal:     Right lower leg: No edema.     Left lower leg: No edema.  Neurological:  General: No focal deficit present.     Mental Status: She is alert and oriented to person, place, and time. Mental status is at baseline.       Ancillary Information    Past Medical History:  Diagnosis Date   Vaginal Pap smear, abnormal      Family History  Problem Relation Age of Onset   Diabetes Mother    Heart disease Mother      Past Surgical History:  Procedure Laterality Date   BREAST ENHANCEMENT SURGERY Bilateral    BREAST SURGERY     NOSE SURGERY      Social History   Socioeconomic History   Marital status: Married    Spouse name: Not on file   Number of children: Not on file   Years of education: Not on file   Highest education level: Not on file  Occupational History   Not on file  Tobacco Use   Smoking status: Never   Smokeless tobacco: Never  Substance and Sexual Activity   Alcohol use: No   Drug use: No   Sexual activity: Yes  Other Topics Concern   Not on file  Social History Narrative   Not on file   Social Determinants of Health   Financial Resource Strain: Low Risk  (03/22/2018)   Overall Financial  Resource Strain (CARDIA)    Difficulty of Paying Living Expenses: Not hard at all  Food Insecurity: Not on file  Transportation Needs: No Transportation Needs (03/22/2018)   PRAPARE - Administrator, Civil Service (Medical): No    Lack of Transportation (Non-Medical): No  Physical Activity: Not on file  Stress: Not on file  Social Connections: Not on file  Intimate Partner Violence: Not on file     No Known Allergies   CBC    Component Value Date/Time   WBC 5.4 03/02/2022 1135   WBC 11.0 (H) 09/01/2018 0620   RBC 4.07 03/02/2022 1135   RBC 4.00 09/01/2018 0620   HGB 11.4 03/02/2022 1135   HCT 35.2 03/02/2022 1135   PLT 240 03/02/2022 1135   MCV 87 03/02/2022 1135   MCH 28.0 03/02/2022 1135   MCH 27.3 09/01/2018 0620   MCHC 32.4 03/02/2022 1135   MCHC 32.2 09/01/2018 0620   RDW 12.5 03/02/2022 1135   LYMPHSABS 1.5 03/02/2022 1135   MONOABS 0.4 04/11/2007 2219   EOSABS 0.1 03/02/2022 1135   BASOSABS 0.0 03/02/2022 1135    Pulmonary Functions Testing Results:    Latest Ref Rng & Units 11/04/2022    9:52 AM  PFT Results  FVC-Pre L 3.28   FVC-Predicted Pre % 92   FVC-Post L 3.13   FVC-Predicted Post % 88   Pre FEV1/FVC % % 75   Post FEV1/FCV % % 82   FEV1-Pre L 2.47   FEV1-Predicted Pre % 86   FEV1-Post L 2.56   DLCO uncorrected ml/min/mmHg 27.22   DLCO UNC% % 130   DLVA Predicted % 135   TLC L 4.51   TLC % Predicted % 92   RV % Predicted % 111     Outpatient Medications Prior to Visit  Medication Sig Dispense Refill   clotrimazole-betamethasone (LOTRISONE) cream Apply 1 Application topically.     levonorgestrel (MIRENA) 20 MCG/24HR IUD 1 each by Intrauterine route once.     loratadine (CLARITIN) 10 MG tablet Take 1 tablet (10 mg total) by mouth daily. 30 tablet 11   fluticasone (FLONASE) 50 MCG/ACT nasal spray Place  1 spray into both nostrils daily. 18.2 mL 3   No facility-administered medications prior to visit.

## 2023-01-19 ENCOUNTER — Ambulatory Visit: Payer: Self-pay | Admitting: *Deleted

## 2023-01-19 NOTE — Telephone Encounter (Signed)
  Chief Complaint: Nipples sore Symptoms: Both nipples sore,breast "At chest, around breast sore." Frequency: 8 days Pertinent Negatives: No nipple discharge, no change in appearance of nipples or breasts.  Disposition: [] ED /[] Urgent Care (no appt availability in office) / [x] Appointment(In office/virtual)/ []  [facility]/ [] Home Care/ [] Refused Recommended Disposition /[] [facility]/ []  Follow-up with PCP Additional Notes: Appt secured according to pt's schedule. Pt needed early am appt. Care advise provided, pt verbalizes understanding.  Reason for Disposition  [1] Breast pain AND [2] cause is not known  Answer Assessment - Initial Assessment Questions 1. SYMPTOM: "What's the main symptom you're concerned about?"  (e.g., lump, pain, rash, nipple discharge)     Nipples sore 2. LOCATION: "Where is the  located?"     Both nipples 3. ONSET: "When did   start?"     8 days, 3 days left 4. PRIOR HISTORY: "Do you have any history of prior problems with your breasts?" (e.g., lumps, cancer, fibrocystic breast disease)     No 5. CAUSE: "What do you think is causing this symptom?"     Unsure 6. OTHER SYMPTOMS: "Do you have any other symptoms?" (e.g., fever, breast pain, redness or rash, nipple discharge)     Breasts sore  Protocols used: Breast Symptoms-A-AH

## 2023-01-24 ENCOUNTER — Ambulatory Visit (INDEPENDENT_AMBULATORY_CARE_PROVIDER_SITE_OTHER): Payer: Medicaid Other | Admitting: Student in an Organized Health Care Education/Training Program

## 2023-01-24 ENCOUNTER — Encounter: Payer: Self-pay | Admitting: Student in an Organized Health Care Education/Training Program

## 2023-01-24 VITALS — BP 90/60 | HR 66 | Temp 98.0°F | Ht 63.0 in | Wt 150.0 lb

## 2023-01-24 DIAGNOSIS — R053 Chronic cough: Secondary | ICD-10-CM

## 2023-01-24 NOTE — Progress Notes (Signed)
Synopsis: Referred in for chronic cough by Alfredia Ferguson, PA-C  Assessment & Plan:   1. Chronic cough  Patient is presenting for the chief complaint of chronic cough over the past few months. Physical exam reveals clear lungs, and congested nasal mucosa with polyps. CXR in April was within normal.   Given her history of eczema and nasal polyps, and the fact that she works at a [employer] and is exposed to solvents and smells, work related asthma and cough variant asthma are on the differential. Upper airway cough syndrome and reflux associated cough are also on the differential.   During our initial visit, I ordered PFT's that were fully within normal. The fact that her cough is 75% improved with the intervention of anti-histamine and intra-nasal corticosteroids following a previous visit lead me to believe this is more UACS rather than work related or cough variant asthma. Symptoms have recurred despite optimal medical management for UACS on today's visit. I will proceed with ordering a methacholine challenge test to rule out asthma completely. I will also obtain a double contrast esophagogram to further evaluate for reflux associated cough.  -Methacholine challenge test to assess for asthma -double contrast esophagogram to assess for reflux associated cough -continue intra-nasal steroids -continue second generation anti-histamine  Return in about 4 weeks (around 02/21/2023).  I spent 30 minutes caring for this patient today, including preparing to see the patient, obtaining a medical history , reviewing a separately obtained history, performing a medically appropriate examination and/or evaluation, counseling and educating the patient/family/caregiver, ordering medications, tests, or procedures, documenting clinical information in the electronic health record, and independently interpreting results (not separately reported/billed) and communicating results to the  patient/family/caregiver  Raechel Chute, MD [facility] 01/24/2023 9:03 AM    End of visit medications:  No orders of the defined types were placed in this encounter.    Current Outpatient Medications:    clotrimazole-betamethasone (LOTRISONE) cream, Apply 1 Application topically., Disp: , Rfl:    fluticasone (FLONASE) 50 MCG/ACT nasal spray, Place 1 spray into both nostrils in the morning and at bedtime., Disp: 18.2 mL, Rfl: 6   levonorgestrel (MIRENA) 20 MCG/24HR IUD, 1 each by Intrauterine route once., Disp: , Rfl:    loratadine (CLARITIN) 10 MG tablet, Take 1 tablet (10 mg total) by mouth daily., Disp: 30 tablet, Rfl: 11   Subjective:   PATIENT ID: Deanna Hutchinson GENDER: female DOB: 06-22-1980, MRN: [id number]  Chief Complaint  Patient presents with   Follow-up    Patient reports worsening cough. Taking Claritin daily and flonase daily.     HPI  Deanna Hutchinson is a pleasant 42 year old healthy female presenting to clinic for follow up regarding cough.  She presents for follow up today. She reports that the cough has actually worsened again over the course of the summer. She could not identify a trigger to the worsening. She is compliant with intra-nasal steroids and anti-histamines. 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 The cough remains non-productive and there is no wheezing.   During our initial visit, the patient reported a chronic cough that she's had most of her life. She feels that the cough got worse over the past few months, and was incessant. The cough was not worse at night or during the day, and did not wake her up from sleep. It is dry and non-productive cough. There are no other associated symptoms. I prescribed a second generation anti-histamine  as well as intra-nasal corticosteroids. PFT's were ordered and were within normal.    She denies any wheezing, shortness of breath, chest tightness, chest pain, or hemoptysis. She also  does not report any fevers, chills, night sweats, weight loss, systemic symptoms, GI or GU symptoms.   She does report having had a cough since she was a child, but it got worse recently. She never had any respiratory illness, no history of asthma, and has never been on inhalers. She denies any symptoms of reflux or of heart burn, there's no sensation of post nasal drip. Diet includes plenty of vegetables and rice, mostly home cooked. Reports enjoying spicy foods, otherwise no foods that could exacerbate reflux identified. Cough not worse at work vs at home, and not better on her days off.   She does have a history of eczema and reports some nasal polyps for which she has had 2 surgeries (1 [place], 1 here, was told that she might require more).  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  She is a non-smoker, no family history of lung disease or asthma, and no family history of lung cancer.  Ancillary information including prior medications, full medical/surgical/family/social histories, and PFTs (when available) are listed below and have been reviewed.   Review of Systems  Constitutional:  Negative for chills, diaphoresis, fever, malaise/fatigue and weight loss.  Respiratory:  Positive for cough. Negative for hemoptysis, sputum production, shortness of breath and wheezing.   Cardiovascular:  Negative for chest pain, palpitations and leg swelling.  Skin:  Negative for rash.     Objective:   Vitals:   01/24/23 0838  BP: 90/60  Pulse: 66  Temp: 98 F (36.7 C)  TempSrc: Temporal  SpO2: 99%  Weight: 150 lb (68 kg)  Height: 5\' 3"  (1.6 m)   99% on RA  BMI Readings from Last 3 Encounters:  01/24/23 26.57 kg/m  11/22/22 27.21 kg/m  10/13/22 27.99 kg/m   Wt Readings from Last 3 Encounters:  01/24/23 150 lb (68 kg)  11/22/22 153 lb 9.6 oz (69.7  kg)  10/13/22 158 lb (71.7 kg)    Physical Exam Constitutional:      General: She is not in acute distress.    Appearance: Normal appearance. She is not ill-appearing.  HENT:     Nose: No congestion.     Mouth/Throat:     Mouth: Mucous membranes are moist.  Cardiovascular:     Rate and Rhythm: Normal rate and regular rhythm.     Pulses: Normal pulses.     Heart sounds: Normal heart sounds.  Pulmonary:     Effort: Pulmonary effort is normal. No respiratory distress.     Breath sounds: Normal breath sounds. No stridor. No wheezing, rhonchi or rales.  Abdominal:     Palpations: Abdomen is soft.  Musculoskeletal:     Right lower leg: No edema.     Left lower leg: No edema.  Neurological:     General: No focal deficit present.     Mental Status: She is alert and oriented to person, place, and time. Mental status is at baseline.     Ancillary Information    Past Medical History:  Diagnosis Date   Vaginal Pap smear, abnormal      Family History  Problem Relation Age of Onset  Diabetes Mother    Heart disease Mother      Past Surgical History:  Procedure Laterality Date   BREAST ENHANCEMENT SURGERY Bilateral    BREAST SURGERY     NOSE SURGERY      Social History   Socioeconomic History   Marital status: Married    Spouse name: Not on file   Number of children: Not on file   Years of education: Not on file   Highest education level: Not on file  Occupational History   Not on file  Tobacco Use   Smoking status: Never   Smokeless tobacco: Never  Substance and Sexual Activity   Alcohol use: No   Drug use: No   Sexual activity: Yes  Other Topics Concern   Not on file  Social History Narrative   Not on file   Social Determinants of Health   Financial Resource Strain: Low Risk  (03/22/2018)   Overall Financial Resource Strain (CARDIA)    Difficulty of Paying Living Expenses: Not hard at all  Food Insecurity: Not on file  Transportation Needs: No  Transportation Needs (03/22/2018)   PRAPARE - Administrator, Civil Service (Medical): No    Lack of Transportation (Non-Medical): No  Physical Activity: Not on file  Stress: Not on file  Social Connections: Not on file  Intimate Partner Violence: Not on file     No Known Allergies   CBC    Component Value Date/Time   WBC 5.4 03/02/2022 1135   WBC 11.0 (H) 09/01/2018 0620   RBC 4.07 03/02/2022 1135   RBC 4.00 09/01/2018 0620   HGB 11.4 03/02/2022 1135   HCT 35.2 03/02/2022 1135   PLT 240 03/02/2022 1135   MCV 87 03/02/2022 1135   MCH 28.0 03/02/2022 1135   MCH 27.3 09/01/2018 0620   MCHC 32.4 03/02/2022 1135   MCHC 32.2 09/01/2018 0620   RDW 12.5 03/02/2022 1135   LYMPHSABS 1.5 03/02/2022 1135   MONOABS 0.4 04/11/2007 2219   EOSABS 0.1 03/02/2022 1135   BASOSABS 0.0 03/02/2022 1135    Pulmonary Functions Testing Results:    Latest Ref Rng & Units 11/04/2022    9:52 AM  PFT Results  FVC-Pre L 3.28   FVC-Predicted Pre % 92   FVC-Post L 3.13   FVC-Predicted Post % 88   Pre FEV1/FVC % % 75   Post FEV1/FCV % % 82   FEV1-Pre L 2.47   FEV1-Predicted Pre % 86   FEV1-Post L 2.56   DLCO uncorrected ml/min/mmHg 27.22   DLCO UNC% % 130   DLVA Predicted % 135   TLC L 4.51   TLC % Predicted % 92   RV % Predicted % 111     Outpatient Medications Prior to Visit  Medication Sig Dispense Refill   clotrimazole-betamethasone (LOTRISONE) cream Apply 1 Application topically.     fluticasone (FLONASE) 50 MCG/ACT nasal spray Place 1 spray into both nostrils in the morning and at bedtime. 18.2 mL 6   levonorgestrel (MIRENA) 20 MCG/24HR IUD 1 each by Intrauterine route once.     loratadine (CLARITIN) 10 MG tablet Take 1 tablet (10 mg total) by mouth daily. 30 tablet 11   No facility-administered medications prior to visit.

## 2023-01-25 ENCOUNTER — Ambulatory Visit
Admission: RE | Admit: 2023-01-25 | Discharge: 2023-01-25 | Disposition: A | Discharge status: Home / Self Care | Payer: Medicaid Other | Source: Ambulatory Visit | Attending: Student in an Organized Health Care Education/Training Program | Admitting: Student in an Organized Health Care Education/Training Program

## 2023-01-25 DIAGNOSIS — R053 Chronic cough: Secondary | ICD-10-CM | POA: Diagnosis not present

## 2023-01-25 DIAGNOSIS — K219 Gastro-esophageal reflux disease without esophagitis: Secondary | ICD-10-CM | POA: Diagnosis not present

## 2023-01-27 ENCOUNTER — Ambulatory Visit (INDEPENDENT_AMBULATORY_CARE_PROVIDER_SITE_OTHER): Payer: Medicaid Other | Admitting: Family Medicine

## 2023-01-27 ENCOUNTER — Encounter: Payer: Self-pay | Admitting: Family Medicine

## 2023-01-27 VITALS — BP 107/81 | HR 71 | Ht 63.0 in | Wt 153.1 lb

## 2023-01-27 DIAGNOSIS — N644 Mastodynia: Secondary | ICD-10-CM

## 2023-01-27 DIAGNOSIS — M545 Low back pain, unspecified: Secondary | ICD-10-CM

## 2023-01-27 DIAGNOSIS — R0789 Other chest pain: Secondary | ICD-10-CM | POA: Diagnosis not present

## 2023-01-27 LAB — POCT URINE PREGNANCY: Preg Test, Ur: NEGATIVE

## 2023-01-27 MED ORDER — MELOXICAM 7.5 MG PO TABS
7.5000 mg | ORAL_TABLET | Freq: Every day | ORAL | 0 refills | Status: DC
Start: 2023-01-27 — End: 2023-03-08

## 2023-01-27 NOTE — Patient Instructions (Addendum)
VISIT SUMMARY:  During your visit, we discussed your intermittent chest discomfort and chronic back pain. We also addressed your concerns about possible pregnancy. Your chest discomfort is localized around the nipple area and is not associated with any known triggers or activities. Your back pain is intermittent and localized to the lumbar region, following a weightlifting injury approximately a year ago. You also have an intrauterine device (IUD) for contraception and have noticed some changes in your body recently.  YOUR PLAN:  -CHEST WALL PAIN: This is a type of pain that occurs in the chest area. We will order a chest x-ray to rule out any bone abnormalities. If your pregnancy test is negative, we will prescribe Meloxicam, a medication that can help with musculoskeletal pain.  -CHRONIC LOW BACK PAIN: This is a persistent pain in the lower back area. We will order a lumbar spine x-ray to check for any structural abnormalities. If your pregnancy test is negative, you may also use Meloxicam for back pain.  Please report to [facility] located at:  [address]  [place], [place] [address]  You do not need an appointment to have xrays completed.   Our office will follow up with  results once available.    -POSSIBLE PREGNANCY: We will order a urine pregnancy test to confirm whether or not you are pregnant before starting any new medication.  INSTRUCTIONS:  Please follow up with [place] after you have completed the chest and lumbar spine x-rays. If your pregnancy test is negative, you can start taking Meloxicam for your chest and back pain. If you have any questions or concerns, don't hesitate to contact us.

## 2023-01-27 NOTE — Progress Notes (Signed)
Established patient visit   Patient: Deanna Hutchinson   DOB: [birth year]   42 y.o. Female  MRN: [id number] Visit Date: 01/27/2023  Today's healthcare provider: Ronnald Ramp, MD   Chief Complaint  Patient presents with   Breathing Problem    Wants to check breast pain in both it comes and goes and whole chest is sore   Subjective     HPI     Breathing Problem    Additional comments: Wants to check breast pain in both it comes and goes and whole chest is sore      Last edited by Clois Comber on 01/27/2023 10:59 AM.       Discussed the use of AI scribe software for clinical note transcription with the patient, who gave verbal consent to proceed.  History of Present Illness   The patient presents with intermittent chest discomfort, specifically localized around the nipple area. The discomfort is described as a sensation of soreness rather than severe pain, and it is not associated with any known triggers or activities. The patient denies any recent trauma or injury to the chest area. The discomfort is not alleviated by any specific measures as the patient prefers to avoid medication unless absolutely necessary. There are no associated symptoms such as swelling, redness, or nipple discharge. The patient also reports a chronic cough, but it is unclear if this is related to the chest discomfort.  In addition to the chest discomfort, the patient also reports a history of back pain following a weightlifting injury approximately a year ago. The pain is described as intermittent and localized to the lumbar region. The patient denies any associated neurological symptoms such as weakness or changes in bowel or bladder function. The back pain has been previously managed with muscle relaxants. The patient also reports a history of a kidney infection, but it is unclear if this is related to the ongoing back pain.  The patient is currently using an intrauterine device (IUD) for  contraception and denies any possibility of pregnancy.         Past Medical History:  Diagnosis Date   Vaginal Pap smear, abnormal     Medications: Outpatient Medications Prior to Visit  Medication Sig   fluticasone (FLONASE) 50 MCG/ACT nasal spray Place 1 spray into both nostrils in the morning and at bedtime.   loratadine (CLARITIN) 10 MG tablet Take 1 tablet (10 mg total) by mouth daily.   clotrimazole-betamethasone (LOTRISONE) cream Apply 1 Application topically. (Patient not taking: Reported on 01/27/2023)   levonorgestrel (MIRENA) 20 MCG/24HR IUD 1 each by Intrauterine route once. (Patient not taking: Reported on 01/27/2023)   No facility-administered medications prior to visit.    Review of Systems      Objective    BP 107/81 (BP Location: Left Arm, Patient Position: Sitting, Cuff Size: Normal)   Pulse 71   Ht 5\' 3"  (1.6 m)   Wt 153 lb 1.6 oz (69.4 kg)   SpO2 98%   BMI 27.12 kg/m     Physical Exam  Back: No gross deformity, scoliosis. +TTP bilateral lumbar region.  No midline or bony TTP. FROM. Strength LEs 5/5 all muscle groups.   Negative SLRs. Sensation intact to light touch bilaterally.    Results for orders placed or performed in visit on 01/27/23  POCT urine pregnancy  Result Value Ref Range   Preg Test, Ur Negative Negative    Assessment & Plan     Problem List Items  Addressed This Visit   None Visit Diagnoses     Anterior chest wall pain    -  Primary   Relevant Medications   meloxicam (MOBIC) 7.5 MG tablet   Breast tenderness in female       Relevant Orders   POCT urine pregnancy (Completed)   Lumbar back pain       Relevant Medications   meloxicam (MOBIC) 7.5 MG tablet   Other Relevant Orders   DG Lumbar Spine 2-3 Views           Chest Wall Pain Intermittent, non-exertional chest wall pain, localized to the nipple area. No associated trauma, erythema, swelling, or nipple discharge. No relief with any medications as patient  prefers not to take medications unless necessary. Physical examination of the chest wall and breasts unremarkable. -Order chest x-ray to rule out any bony abnormalities. -If pregnancy test is negative, prescribe Meloxicam for potential musculoskeletal pain.  Chronic Low Back Pain History of injury during workout approximately a year ago. Pain is intermittent and localized to the lumbar region. No associated neurological symptoms. Physical examination including strength testing unremarkable. -Order lumbar spine x-ray to evaluate for any structural abnormalities. -If pregnancy test is negative, patient may also use Meloxicam for back pain.  Possible Pregnancy Patient has an IUD but reports some changes in her body a few months ago that led her to take a home pregnancy test. Currently experiencing breast tenderness. -Order urine pregnancy test to rule out pregnancy before starting Meloxicam. - test was negative         Return if symptoms worsen or fail to improve.         Ronnald Ramp, MD  [facility] [phone] (phone) [phone] (fax)  [facility]

## 2023-02-14 ENCOUNTER — Telehealth: Payer: Self-pay | Admitting: Student in an Organized Health Care Education/Training Program

## 2023-02-14 NOTE — Telephone Encounter (Signed)
I was trying to get the methacholine challenge scheduled for this patient before her appt with Dr. Aundria Rud on 02/22/23. Patient stated that since she had the swallowing test she no longer needed to do the methacholine challenge. I didn't see any notes about this. 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

## 2023-02-22 ENCOUNTER — Ambulatory Visit: Payer: Medicaid Other | Admitting: Student in an Organized Health Care Education/Training Program

## 2023-02-22 ENCOUNTER — Encounter: Payer: Self-pay | Admitting: Student in an Organized Health Care Education/Training Program

## 2023-02-22 VITALS — BP 90/66 | HR 72 | Temp 97.6°F | Ht 63.0 in | Wt 152.6 lb

## 2023-02-22 DIAGNOSIS — R053 Chronic cough: Secondary | ICD-10-CM

## 2023-02-22 NOTE — Telephone Encounter (Signed)
Nothing further needed at this time.

## 2023-02-22 NOTE — Progress Notes (Signed)
Synopsis: Referred in for chronic cough by Deanna Ferguson, PA-C  Assessment & Plan:   1. Chronic cough  Presents for follow up of chronic cough with a benign pulmonary exam. Workup so far has included PFT's that were fully within normal. We attempted anti-histamines which were mildly helpful, but did not fully resolve her symptoms. Discussed that with the double contrast esophagogram showing severe reflux, this is unlikely to improve without aggressive dietary modification. Again we discussed GERD consistent diet and we will provide a print out of recommendations. I've also asked Deanna Hutchinson to continue using her loratadine to treat concommitant upper airway cough syndrome.  Given her history of eczema and nasal polyps, and the fact that she works at a [employer] and is exposed to solvents and smells, work related asthma and cough variant asthma remain on the differential. While PFT's were within normal, asthma is not fully ruled out. I had initially ordered a methacholine challenge test to rule this out but given significant findings from the double contrast esophagogram, we will hold off until follow up. Should symptoms persist, will consider proceeding with MIC.    -Methacholine challenge test to assess for asthma on follow up if symptoms persist -GERD consistent diet -continue intranasal steroids and second generation anti-histamine.  Return in about 6 months (around 08/23/2023).  I spent 30 minutes caring for this patient today, including preparing to see the patient, obtaining a medical history , reviewing a separately obtained history, performing a medically appropriate examination and/or evaluation, counseling and educating the patient/family/caregiver, and documenting clinical information in the electronic health record  Deanna Chute, MD [facility]   End of visit medications:  No orders of the defined types were placed in this encounter.    Current Outpatient  Medications:    clotrimazole-betamethasone (LOTRISONE) cream, Apply 1 Application topically., Disp: , Rfl:    fluticasone (FLONASE) 50 MCG/ACT nasal spray, Place 1 spray into both nostrils in the morning and at bedtime., Disp: 18.2 mL, Rfl: 6   levonorgestrel (MIRENA) 20 MCG/24HR IUD, 1 each by Intrauterine route once., Disp: , Rfl:    loratadine (CLARITIN) 10 MG tablet, Take 1 tablet (10 mg total) by mouth daily., Disp: 30 tablet, Rfl: 11   meloxicam (MOBIC) 7.5 MG tablet, Take 1 tablet (7.5 mg total) by mouth daily., Disp: 30 tablet, Rfl: 0   Subjective:   PATIENT ID: Deanna Hutchinson GENDER: female DOB: November 15, 1980, MRN: [id number]  Chief Complaint  Patient presents with   Follow-up    Dry cough. No shortness of breath or wheezing. Is using Claritin and Flonase.     HPI  Deanna Hutchinson is a pleasant 42 year old healthy female presenting to clinic for follow up regarding cough.  She continues to have the cough, but the intensity of it ebbs and flows. She's had days with significant improvement and others with worsening. I had previously called her informing her of the results of her swallowing study and counseled her on a GERD consistent diet. Today, she reports that following said diet has been difficult. She enjoys spicy foods a lot. She also works late and at times ends up having a very late dinner. She is compliant with intra-nasal steroids and anti-histamines. 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 The cough remains non-productive and there is no wheezing.   During our initial visit, the patient reported a chronic cough that she's had most of her life. She feels that  the cough got worse over the past few months, and was incessant. The cough was not worse at night or during the day, and did not wake her up from sleep. It is dry and non-productive cough. There are no other associated symptoms. I prescribed a second generation anti-histamine as well as intra-nasal  corticosteroids. PFT's were ordered and were within normal.    She denies any wheezing, shortness of breath, chest tightness, chest pain, or hemoptysis. She also does not report any fevers, chills, night sweats, weight loss, systemic symptoms, GI or GU symptoms.   She does report having had a cough since she was a child, but it got worse recently. She never had any respiratory illness, no history of asthma, and has never been on inhalers. She denies any symptoms of reflux or of heart burn, there's no sensation of post nasal drip. Diet includes plenty of vegetables and rice, mostly home cooked. Reports enjoying spicy foods, otherwise no foods that could exacerbate reflux identified. Cough not worse at work vs at home, and not better on her days off.   She does have a history of eczema and reports some nasal polyps for which she has had 2 surgeries (1 [place], 1 here, was told that she might require more).  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  She is a non-smoker, no family history of lung disease or asthma, and no family history of lung cancer.  Ancillary information including prior medications, full medical/surgical/family/social histories, and PFTs (when available) are listed below and have been reviewed.   Review of Systems  Constitutional:  Negative for chills, diaphoresis, fever, malaise/fatigue and weight loss.  Respiratory:  Positive for cough. Negative for hemoptysis, sputum production, shortness of breath and wheezing.   Cardiovascular:  Negative for chest pain, palpitations and leg swelling.  Skin:  Negative for rash.     Objective:   Vitals:   02/22/23 0858  BP: 90/66  Pulse: 72  Temp: 97.6 F (36.4 C)  TempSrc: Temporal  SpO2: 96%  Weight: 152 lb 9.6 oz (69.2 kg)  Height: 5\' 3"  (1.6 m)   96% on RA  BMI Readings from Last 3  Encounters:  02/22/23 27.03 kg/m  01/27/23 27.12 kg/m  01/24/23 26.57 kg/m   Wt Readings from Last 3 Encounters:  02/22/23 152 lb 9.6 oz (69.2 kg)  01/27/23 153 lb 1.6 oz (69.4 kg)  01/24/23 150 lb (68 kg)    Physical Exam Constitutional:      General: She is not in acute distress.    Appearance: Normal appearance. She is not ill-appearing.  HENT:     Nose: No congestion.     Mouth/Throat:     Mouth: Mucous membranes are moist.  Cardiovascular:     Rate and Rhythm: Normal rate and regular rhythm.     Pulses: Normal pulses.     Heart sounds: Normal heart sounds.  Pulmonary:     Effort: Pulmonary effort is normal. No respiratory distress.     Breath sounds: Normal breath sounds. No stridor. No wheezing, rhonchi or rales.  Abdominal:     Palpations: Abdomen is soft.  Musculoskeletal:     Right lower leg: No edema.     Left lower leg: No edema.  Neurological:     General: No focal deficit present.  Mental Status: She is alert and oriented to person, place, and time. Mental status is at baseline.       Ancillary Information    Past Medical History:  Diagnosis Date   Vaginal Pap smear, abnormal      Family History  Problem Relation Age of Onset   Diabetes Mother    Heart disease Mother      Past Surgical History:  Procedure Laterality Date   BREAST ENHANCEMENT SURGERY Bilateral    BREAST SURGERY     NOSE SURGERY      Social History   Socioeconomic History   Marital status: Married    Spouse name: Not on file   Number of children: Not on file   Years of education: Not on file   Highest education level: Not on file  Occupational History   Not on file  Tobacco Use   Smoking status: Never   Smokeless tobacco: Never  Substance and Sexual Activity   Alcohol use: No   Drug use: No   Sexual activity: Yes  Other Topics Concern   Not on file  Social History Narrative   Not on file   Social Determinants of Health   Financial Resource Strain:  Low Risk  (03/22/2018)   Overall Financial Resource Strain (CARDIA)    Difficulty of Paying Living Expenses: Not hard at all  Food Insecurity: Not on file  Transportation Needs: No Transportation Needs (03/22/2018)   PRAPARE - Administrator, Civil Service (Medical): No    Lack of Transportation (Non-Medical): No  Physical Activity: Not on file  Stress: Not on file  Social Connections: Not on file  Intimate Partner Violence: Not on file     No Known Allergies   CBC    Component Value Date/Time   WBC 5.4 03/02/2022 1135   WBC 11.0 (H) 09/01/2018 0620   RBC 4.07 03/02/2022 1135   RBC 4.00 09/01/2018 0620   HGB 11.4 03/02/2022 1135   HCT 35.2 03/02/2022 1135   PLT 240 03/02/2022 1135   MCV 87 03/02/2022 1135   MCH 28.0 03/02/2022 1135   MCH 27.3 09/01/2018 0620   MCHC 32.4 03/02/2022 1135   MCHC 32.2 09/01/2018 0620   RDW 12.5 03/02/2022 1135   LYMPHSABS 1.5 03/02/2022 1135   MONOABS 0.4 04/11/2007 2219   EOSABS 0.1 03/02/2022 1135   BASOSABS 0.0 03/02/2022 1135    Pulmonary Functions Testing Results:    Latest Ref Rng & Units 11/04/2022    9:52 AM  PFT Results  FVC-Pre L 3.28   FVC-Predicted Pre % 92   FVC-Post L 3.13   FVC-Predicted Post % 88   Pre FEV1/FVC % % 75   Post FEV1/FCV % % 82   FEV1-Pre L 2.47   FEV1-Predicted Pre % 86   FEV1-Post L 2.56   DLCO uncorrected ml/min/mmHg 27.22   DLCO UNC% % 130   DLVA Predicted % 135   TLC L 4.51   TLC % Predicted % 92   RV % Predicted % 111     Outpatient Medications Prior to Visit  Medication Sig Dispense Refill   clotrimazole-betamethasone (LOTRISONE) cream Apply 1 Application topically.     fluticasone (FLONASE) 50 MCG/ACT nasal spray Place 1 spray into both nostrils in the morning and at bedtime. 18.2 mL 6   levonorgestrel (MIRENA) 20 MCG/24HR IUD 1 each by Intrauterine route once.     loratadine (CLARITIN) 10 MG tablet Take 1 tablet (10 mg total) by  mouth daily. 30 tablet 11   meloxicam (MOBIC)  7.5 MG tablet Take 1 tablet (7.5 mg total) by mouth daily. 30 tablet 0   No facility-administered medications prior to visit.

## 2023-03-07 ENCOUNTER — Other Ambulatory Visit: Payer: Self-pay | Admitting: Family Medicine

## 2023-03-07 DIAGNOSIS — R0789 Other chest pain: Secondary | ICD-10-CM

## 2023-04-10 ENCOUNTER — Other Ambulatory Visit: Payer: Self-pay | Admitting: Physician Assistant

## 2023-04-10 DIAGNOSIS — R0789 Other chest pain: Secondary | ICD-10-CM

## 2023-06-02 NOTE — Progress Notes (Deleted)
      Established patient visit   Patient: Deanna Hutchinson   DOB: 1981-04-20   43 y.o. Female  MRN: [id number] Visit Date: 06/03/2023  Today's healthcare provider: Ronnald Ramp, MD   No chief complaint on file.  Subjective       Discussed the use of AI scribe software for clinical note transcription with the patient, who gave verbal consent to proceed.  History of Present Illness             Past Medical History:  Diagnosis Date   Vaginal Pap smear, abnormal     Medications: Outpatient Medications Prior to Visit  Medication Sig   clotrimazole-betamethasone (LOTRISONE) cream Apply 1 Application topically.   fluticasone (FLONASE) 50 MCG/ACT nasal spray Place 1 spray into both nostrils in the morning and at bedtime.   levonorgestrel (MIRENA) 20 MCG/24HR IUD 1 each by Intrauterine route once.   loratadine (CLARITIN) 10 MG tablet Take 1 tablet (10 mg total) by mouth daily.   meloxicam (MOBIC) 7.5 MG tablet Take 1 tablet (7.5 mg total) by mouth daily. Advised to take with meals as most common side effect of taking meloxicam: GI bleeding. This medication is contraindicated in pregnancy   No facility-administered medications prior to visit.    Review of Systems  {Insert previous labs (optional):23779} {See past labs  Heme  Chem  Endocrine  Serology  Results Review (optional):1}   Objective    There were no vitals taken for this visit. {Insert last BP/Wt (optional):23777}{See vitals history (optional):1}    Physical Exam  ***  No results found for any visits on 06/03/23.  Assessment & Plan     Problem List Items Addressed This Visit   None   Assessment and Plan              No follow-ups on file.         Ronnald Ramp, MD  [facility] [phone] (phone) [phone] (fax)  [facility]

## 2023-06-03 ENCOUNTER — Ambulatory Visit: Payer: Medicaid Other | Admitting: Family Medicine

## 2023-06-07 ENCOUNTER — Ambulatory Visit
Admission: RE | Admit: 2023-06-07 | Discharge: 2023-06-07 | Disposition: A | Discharge status: Home / Self Care | Payer: Medicaid Other | Attending: Family Medicine | Admitting: Family Medicine

## 2023-06-07 ENCOUNTER — Ambulatory Visit
Admission: RE | Admit: 2023-06-07 | Discharge: 2023-06-07 | Disposition: A | Discharge status: Home / Self Care | Payer: Medicaid Other | Source: Ambulatory Visit | Attending: Family Medicine

## 2023-06-07 DIAGNOSIS — M5136 Other intervertebral disc degeneration, lumbar region with discogenic back pain only: Secondary | ICD-10-CM | POA: Diagnosis not present

## 2023-06-07 DIAGNOSIS — M48061 Spinal stenosis, lumbar region without neurogenic claudication: Secondary | ICD-10-CM | POA: Diagnosis not present

## 2023-06-07 DIAGNOSIS — M545 Low back pain, unspecified: Secondary | ICD-10-CM | POA: Insufficient documentation

## 2023-06-07 DIAGNOSIS — M47816 Spondylosis without myelopathy or radiculopathy, lumbar region: Secondary | ICD-10-CM | POA: Diagnosis not present

## 2023-06-22 ENCOUNTER — Other Ambulatory Visit: Payer: Self-pay | Admitting: Physician Assistant

## 2023-06-22 DIAGNOSIS — R053 Chronic cough: Secondary | ICD-10-CM

## 2023-07-06 ENCOUNTER — Encounter: Payer: Self-pay | Admitting: Family Medicine

## 2023-07-06 ENCOUNTER — Ambulatory Visit: Payer: Medicaid Other | Admitting: Family Medicine

## 2023-07-06 VITALS — BP 111/84 | HR 74 | Ht 63.0 in | Wt 146.2 lb

## 2023-07-06 DIAGNOSIS — L309 Dermatitis, unspecified: Secondary | ICD-10-CM | POA: Insufficient documentation

## 2023-07-06 DIAGNOSIS — R61 Generalized hyperhidrosis: Secondary | ICD-10-CM | POA: Diagnosis not present

## 2023-07-06 DIAGNOSIS — L299 Pruritus, unspecified: Secondary | ICD-10-CM | POA: Diagnosis not present

## 2023-07-06 DIAGNOSIS — L308 Other specified dermatitis: Secondary | ICD-10-CM | POA: Diagnosis not present

## 2023-07-06 MED ORDER — FEXOFENADINE HCL 180 MG PO TABS
180.0000 mg | ORAL_TABLET | Freq: Every day | ORAL | 1 refills | Status: AC
Start: 2023-07-06 — End: ?

## 2023-07-06 MED ORDER — MOMETASONE FUROATE 0.1 % EX CREA
1.0000 | TOPICAL_CREAM | Freq: Every day | CUTANEOUS | 3 refills | Status: AC
Start: 2023-07-06 — End: ?

## 2023-07-06 NOTE — Assessment & Plan Note (Signed)
 Concerns for increase diaphoresis in axillary region daily Very bothersome to patent Would like to discuss further with derm Referral placed

## 2023-07-06 NOTE — Progress Notes (Signed)
 Acute Office Visit  Introduced to nurse practitioner role and practice setting.  All questions answered.  Discussed provider/patient relationship and expectations.  Subjective:     Patient ID: Deanna Hutchinson, female    DOB: 1980-09-16, 43 y.o.   MRN: [id number]  Chief Complaint  Patient presents with   skin itching    Normally goes to Dr. Cheree Ditto in [place]. But has been happening for years. States she has asthma and psoriasis     Deanna Hutchinson "Deanna Hutchinson" is a 43 year old female with eczema who presents with concerns for skin itching and redness last week, that has since resolved.   She experienced a sudden onset of redness, itching, and burning on her entire face last week, which has since gone away. This is the first time her whole face has been affected, although she typically experiences redness and itching around her eyes. The symptoms sometimes extend to her armpits. She denies any seasonal pattern to her symptoms, stating they occur year-round.  She uses a cream prescribed by her dermatologist, which helps temporarily but is not strong enough for severe flares. The cream is applied around her eyes and neck every three to four days. She sometimes takes a Claritin but prefers not to take medication daily due to concerns about long-term effects, influenced by her husband's concerns about potential health risks.  She experiences intermittent coughing episodes, during which she coughs continuously. States saw GI - who told her is was gastric reflux. She denies asthma hx. No breathing difficulties or throat itching, stating her symptoms are limited to her skin. She manages her acid reflux through dietary modifications and does not take medication for it.  She also have concerns for increased axillary sweating.    Review of Systems  All other systems reviewed and are negative.     Objective:    BP 111/84   Pulse 74   Ht 5\' 3"  (1.6 m)   Wt 146 lb 3.2 oz (66.3 kg)   SpO2 100%   BMI 25.90 kg/m     Physical Exam Vitals reviewed.  Constitutional:      General: She is not in acute distress.    Appearance: Normal appearance. She is overweight. She is not toxic-appearing or diaphoretic.  HENT:     Head: Normocephalic.     Nose: Nose normal.     Mouth/Throat:     Mouth: Mucous membranes are moist.     Pharynx: Oropharynx is clear.  Eyes:     Extraocular Movements: Extraocular movements intact.     Pupils: Pupils are equal, round, and reactive to light.  Cardiovascular:     Rate and Rhythm: Normal rate and regular rhythm.     Pulses: Normal pulses.     Heart sounds: Normal heart sounds. No murmur heard.    No friction rub. No gallop.  Pulmonary:     Effort: No respiratory distress.     Breath sounds: No stridor. No wheezing, rhonchi or rales.  Chest:     Chest wall: No tenderness.  Musculoskeletal:     Right lower leg: No edema.     Left lower leg: No edema.  Skin:    General: Skin is warm and dry.     Capillary Refill: Capillary refill takes less than 2 seconds.     Coloration: Skin is not ashen, cyanotic, jaundiced, mottled, pale or sallow.     Findings: No abrasion, abscess, acne, bruising, ecchymosis, erythema, signs of injury, lesion, petechiae, rash or wound.  Comments: No evidence of skin breakdown or rash or hives during visit.  Neurological:     General: No focal deficit present.     Mental Status: She is alert and oriented to person, place, and time. Mental status is at baseline.  Psychiatric:        Mood and Affect: Mood normal.        Behavior: Behavior normal.        Thought Content: Thought content normal.        Judgment: Judgment normal.     No results found for any visits on 07/06/23.      Assessment & Plan:   Problem List Items Addressed This Visit       Musculoskeletal and Integument   Eczema   Continue use of mometasone for eczema  on neck and below Continue use of opzelura for eczema for facial/eye flares Keep skin hydrated with  emollients (Aquaphor, Cetaphil, Eucerin may be used) Avoid triggers Referral to derm placed for pt      Relevant Medications   mometasone (ELOCON) 0.1 % cream   Other Relevant Orders   Ambulatory referral to Dermatology   Hyperhidrosis   Concerns for increase diaphoresis in axillary region daily Very bothersome to patent Would like to discuss further with derm Referral placed      Relevant Orders   Ambulatory referral to Dermatology     Other   Pruritic condition - Primary   Concerns for pruritic hives one week ago No evidence of skin erythema, hives, swelling, pruritus today Denies throat itching, oral/throat swelling DOB, or SOB. Hx of eczema Based of pt's description appears pt is experiencing intermittent pruritus due to environmental exposure Recommend using daily antihistamine to reduce inflammation response Discussed taking daily instead of intermittent for best prevention Be aware of trigger, journal exposure if happens again If DOB, throat swelling, oral itching, SOB - please present ED for acute care Prescribed Allegra 180 mg daily        Relevant Medications   fexofenadine (ALLEGRA ALLERGY) 180 MG tablet      Meds ordered this encounter  Medications   mometasone (ELOCON) 0.1 % cream    Sig: Apply 1 Application topically daily.    Dispense:  45 g    Refill:  3   fexofenadine (ALLEGRA ALLERGY) 180 MG tablet    Sig: Take 1 tablet (180 mg total) by mouth daily.    Dispense:  90 tablet    Refill:  1    Return if symptoms worsen or fail to improve.  Deanna Provencal, FNP

## 2023-07-06 NOTE — Assessment & Plan Note (Addendum)
 Concerns for pruritic hives one week ago No evidence of skin erythema, hives, swelling, pruritus today Denies throat itching, oral/throat swelling DOB, or SOB. Hx of eczema Based of pt's description appears pt is experiencing intermittent pruritus due to environmental exposure Recommend using daily antihistamine to reduce inflammation response Discussed taking daily instead of intermittent for best prevention Be aware of trigger, journal exposure if happens again If DOB, throat swelling, oral itching, SOB - please present ED for acute care Prescribed Allegra 180 mg daily

## 2023-07-06 NOTE — Assessment & Plan Note (Addendum)
 Continue use of mometasone for eczema  on neck and below Continue use of opzelura for eczema for facial/eye flares Keep skin hydrated with emollients (Aquaphor, Cetaphil, Eucerin may be used) Avoid triggers Referral to derm placed for pt

## 2023-07-20 DIAGNOSIS — H5203 Hypermetropia, bilateral: Secondary | ICD-10-CM | POA: Diagnosis not present

## 2023-08-08 ENCOUNTER — Other Ambulatory Visit: Payer: Self-pay | Admitting: Student in an Organized Health Care Education/Training Program

## 2023-08-08 DIAGNOSIS — R053 Chronic cough: Secondary | ICD-10-CM

## 2023-10-31 NOTE — Progress Notes (Signed)
 Established patient visit  Patient: Deanna Hutchinson   DOB: July 18, 1980   43 y.o. Female  MRN: [id number] Visit Date: 11/03/2023  Today's healthcare provider: Jolynn Spencer, PA-C   Chief Complaint  Patient presents with   Sore Breast    Sore breast for 10 days (both) Wants  pt referral for Mammo   Subjective     HPI     Sore Breast    Additional comments: Sore breast for 10 days (both) Wants  pt referral for Mammo      Last edited by Marylen Odella CROME, CMA on 11/03/2023  9:58 AM.       Discussed the use of AI scribe software for clinical note transcription with the patient, who gave verbal consent to proceed.  History of Present Illness Deanna Hutchinson is a 43 year old female who presents with bilateral breast soreness and heaviness.  She has experienced bilateral breast soreness and heaviness for ten days, starting with nipple tenderness and progressing to a feeling of thickness and soreness in both breasts, with a severity of 2-3 out of 10. There is no rash, skin changes, or nipple discharge. She denies recent trauma or exposure to cold environments and has not noticed any lumps during self-examination.  She has breast implants placed in 2017. Her menstrual history includes amenorrhea due to an intrauterine device placed five years ago, with occasional spotting but no regular periods. She has not had a recent mammogram and does not recall the last one.  She has prediabetes and allergies, using saline spray but avoiding other medications. She experiences occasional insomnia with difficulty falling asleep.       07/06/2023    8:41 AM 01/27/2023   11:00 AM 06/22/2022    2:54 PM  Depression screen PHQ 2/9  Decreased Interest 0 0 0  Down, Depressed, Hopeless 0 0 0  PHQ - 2 Score 0 0 0  Altered sleeping 0 0 1  Tired, decreased energy 0 3 2  Change in appetite 0 0 1  Feeling bad or failure about yourself  0 0 0  Trouble concentrating 0 0 0  Moving slowly or fidgety/restless 0 0 0   Suicidal thoughts 0 0 0  PHQ-9 Score 0 3 4  Difficult doing work/chores  Not difficult at all Not difficult at all      07/06/2023    8:41 AM 01/27/2023   11:00 AM  GAD 7 : Generalized Anxiety Score  Nervous, Anxious, on Edge 0 0  Control/stop worrying 0 0  Worry too much - different things 0 0  Trouble relaxing 0 0  Restless 0 0  Easily annoyed or irritable 0 0  Afraid - awful might happen 0 0  Total GAD 7 Score 0 0  Anxiety Difficulty  Not difficult at all    Medications: Outpatient Medications Prior to Visit  Medication Sig   fexofenadine  (ALLEGRA  ALLERGY) 180 MG tablet Take 1 tablet (180 mg total) by mouth daily. (Patient not taking: Reported on 11/03/2023)   fluticasone  (FLONASE ) 50 MCG/ACT nasal spray PLACE 1 SPRAY INTO BOTH NOSTRILS IN THE MORNING AND AT BEDTIME. (Patient not taking: Reported on 11/03/2023)   levonorgestrel  (MIRENA ) 20 MCG/24HR IUD 1 each by Intrauterine route once. (Patient not taking: Reported on 11/03/2023)   mometasone  (ELOCON ) 0.1 % cream Apply 1 Application topically daily. (Patient not taking: Reported on 11/03/2023)   Ruxolitinib Phosphate (OPZELURA) 1.5 % CREA Apply topically. (Patient not taking: Reported on 11/03/2023)   No facility-administered medications  prior to visit.    Review of Systems  All other systems reviewed and are negative.  All negative Except see HPI       Objective    BP 98/65 (BP Location: Left Arm, Patient Position: Sitting, Cuff Size: Normal)   Pulse 67   Resp 16   Ht 5' 3 (1.6 m)   Wt 141 lb (64 kg)   SpO2 98%   BMI 24.98 kg/m     Physical Exam Vitals reviewed.  Constitutional:      General: She is not in acute distress.    Appearance: Normal appearance. She is well-developed. She is not diaphoretic.  HENT:     Head: Normocephalic and atraumatic.   Eyes:     General: No scleral icterus.    Conjunctiva/sclera: Conjunctivae normal.   Neck:     Thyroid: No thyromegaly.   Cardiovascular:     Rate and  Rhythm: Normal rate and regular rhythm.     Pulses: Normal pulses.     Heart sounds: Normal heart sounds. No murmur heard. Pulmonary:     Effort: Pulmonary effort is normal. No respiratory distress.     Breath sounds: Normal breath sounds. No wheezing, rhonchi or rales.   Musculoskeletal:     Cervical back: Neck supple.     Right lower leg: No edema.     Left lower leg: No edema.  Lymphadenopathy:     Cervical: No cervical adenopathy.   Skin:    General: Skin is warm and dry.     Findings: No rash.   Neurological:     Mental Status: She is alert and oriented to person, place, and time. Mental status is at baseline.   Psychiatric:        Mood and Affect: Mood normal.        Behavior: Behavior normal.      Results for orders placed or performed in visit on 11/03/23  POCT urine pregnancy  Result Value Ref Range   Preg Test, Ur Negative Negative        Assessment & Plan Breast tenderness Bilateral breast soreness and heaviness for ten days. - Refer for mammogram to evaluate breast tenderness. - Instruct her to schedule the mammogram.  Prediabetes Prediabetes last evaluated two years ago. Requires periodic monitoring to assess progression and manage risk factors. - Schedule follow-up in two months to recheck lipids and prediabetes status.  Allergic rhinitis Symptoms include sneezing and nasal congestion. Prefers non-medication management. - Recommend continued use of nasal saline rinse or spray for symptom management.  General Health Maintenance Due for routine health screenings. Discussed potential pregnancy despite IUD. - Check pregnancy status due to irregular periods and IUD use.  Breast pain in female (Primary) - MM 3D SCREENING MAMMOGRAM BILATERAL BREAST; Future she is due to screening - POCT urine pregnancy  Moderate mixed hyperlipidemia not requiring statin therapy Chronic and previously stable And will repeat lipid panel at the follow-up  Seasonal  allergies - Avoidance measures discussed. - Use nasal saline rinses before nose sprays such as with Neilmed Sinus Rinse bottle.  Use distilled water.   - Use Flonase  2 sprays each nostril daily. Aim upward and outward. - Use Zyrtec 10 mg daily.   Orders Placed This Encounter  Procedures   MM 3D SCREENING MAMMOGRAM BILATERAL BREAST    Standing Status:   Future    Expiration Date:   11/02/2024    Reason for Exam (SYMPTOM  OR DIAGNOSIS REQUIRED):   screening  Is the patient pregnant?:   No    Preferred imaging location?:   [facility]   POCT urine pregnancy    Return in about 2 months (around 01/03/2024) for chronic disease f/u.   The patient was advised to call back or seek an in-person evaluation if the symptoms worsen or if the condition fails to improve as anticipated.  I discussed the assessment and treatment plan with the patient. The patient was provided an opportunity to ask questions and all were answered. The patient agreed with the plan and demonstrated an understanding of the instructions.  I, Daphine Loch, PA-C have reviewed all documentation for this visit. The documentation on 11/03/2023  for the exam, diagnosis, procedures, and orders are all accurate and complete.  Jolynn Spencer, [facility], MMS [facility] [phone] (phone) [phone] (fax)  [facility]

## 2023-11-03 ENCOUNTER — Encounter: Payer: Self-pay | Admitting: Physician Assistant

## 2023-11-03 ENCOUNTER — Ambulatory Visit (INDEPENDENT_AMBULATORY_CARE_PROVIDER_SITE_OTHER): Admitting: Physician Assistant

## 2023-11-03 VITALS — BP 98/65 | HR 67 | Resp 16 | Ht 63.0 in | Wt 141.0 lb

## 2023-11-03 DIAGNOSIS — E782 Mixed hyperlipidemia: Secondary | ICD-10-CM | POA: Diagnosis not present

## 2023-11-03 DIAGNOSIS — R61 Generalized hyperhidrosis: Secondary | ICD-10-CM

## 2023-11-03 DIAGNOSIS — J302 Other seasonal allergic rhinitis: Secondary | ICD-10-CM

## 2023-11-03 DIAGNOSIS — N644 Mastodynia: Secondary | ICD-10-CM | POA: Diagnosis not present

## 2023-11-03 DIAGNOSIS — R7303 Prediabetes: Secondary | ICD-10-CM | POA: Diagnosis not present

## 2023-11-03 DIAGNOSIS — L299 Pruritus, unspecified: Secondary | ICD-10-CM

## 2023-11-03 DIAGNOSIS — L308 Other specified dermatitis: Secondary | ICD-10-CM

## 2023-11-03 LAB — POCT URINE PREGNANCY: Preg Test, Ur: NEGATIVE

## 2023-11-04 DIAGNOSIS — R7303 Prediabetes: Secondary | ICD-10-CM | POA: Insufficient documentation

## 2023-11-04 DIAGNOSIS — N644 Mastodynia: Secondary | ICD-10-CM | POA: Insufficient documentation

## 2023-11-04 DIAGNOSIS — J302 Other seasonal allergic rhinitis: Secondary | ICD-10-CM | POA: Insufficient documentation

## 2023-11-07 ENCOUNTER — Ambulatory Visit: Admitting: Physician Assistant

## 2023-11-07 ENCOUNTER — Other Ambulatory Visit (HOSPITAL_COMMUNITY)
Admission: RE | Admit: 2023-11-07 | Discharge: 2023-11-07 | Disposition: A | Discharge status: Home / Self Care | Source: Ambulatory Visit | Attending: Nurse Practitioner | Admitting: Nurse Practitioner

## 2023-11-07 ENCOUNTER — Encounter: Payer: Self-pay | Admitting: Nurse Practitioner

## 2023-11-07 ENCOUNTER — Ambulatory Visit: Admitting: Nurse Practitioner

## 2023-11-07 VITALS — BP 102/66 | HR 84 | Resp 16 | Ht 63.0 in | Wt 140.5 lb

## 2023-11-07 DIAGNOSIS — N939 Abnormal uterine and vaginal bleeding, unspecified: Secondary | ICD-10-CM

## 2023-11-07 LAB — CBC WITH DIFFERENTIAL/PLATELET
Absolute Lymphocytes: 1358 {cells}/uL (ref 850–3900)
Absolute Monocytes: 288 {cells}/uL (ref 200–950)
Basophils Absolute: 29 {cells}/uL (ref 0–200)
Basophils Relative: 0.6 %
Eosinophils Absolute: 101 {cells}/uL (ref 15–500)
Eosinophils Relative: 2.1 %
HCT: 36.2 % (ref 35.0–45.0)
Hemoglobin: 11.3 g/dL — ABNORMAL LOW (ref 11.7–15.5)
MCH: 28 pg (ref 27.0–33.0)
MCHC: 31.2 g/dL — ABNORMAL LOW (ref 32.0–36.0)
MCV: 89.8 fL (ref 80.0–100.0)
MPV: 11 fL (ref 7.5–12.5)
Monocytes Relative: 6 %
Neutro Abs: 3024 {cells}/uL (ref 1500–7800)
Neutrophils Relative %: 63 %
Platelets: 215 10*3/uL (ref 140–400)
RBC: 4.03 10*6/uL (ref 3.80–5.10)
RDW: 12.3 % (ref 11.0–15.0)
Total Lymphocyte: 28.3 %
WBC: 4.8 10*3/uL (ref 3.8–10.8)

## 2023-11-07 LAB — TSH: TSH: 0.64 m[IU]/L

## 2023-11-07 NOTE — Progress Notes (Signed)
 BP 102/66   Pulse 84   Resp 16   Ht 5' 3 (1.6 m)   Wt 140 lb 8 oz (63.7 kg)   SpO2 97%   Breastfeeding No   BMI 24.89 kg/m    Subjective:    Patient ID: Deanna Hutchinson, female    DOB: [birth year], 43 y.o.   MRN: [id number]  HPI: Deanna Hutchinson is a 43 y.o. female  Chief Complaint  Patient presents with   Vaginal Bleeding    Spotting over a month, on/off    Discussed the use of AI scribe software for clinical note transcription with the patient, who gave verbal consent to proceed.  History of Present Illness Deanna Hutchinson is a 43 year old female who presents with vaginal bleeding.  She has been experiencing intermittent spotting with a light red color over the past few days. She has a Mirena  IUD, which she has had for almost five years, and typically does not have regular periods with it. She does not recall her last normal period, which was before the insertion of the IUD.  A urine pregnancy test on November 03, 2023, was negative. Her last Pap smear in 2023 showed some abnormal cells but was negative for HPV.  Her husband is particularly concerned about the possibility of cancer, as his mother had a similar experience with spotting that was not taken seriously and later resulted in a cancer diagnosis.         11/07/2023    9:49 AM 07/06/2023    8:41 AM 01/27/2023   11:00 AM  Depression screen PHQ 2/9  Decreased Interest 0 0 0  Down, Depressed, Hopeless 0 0 0  PHQ - 2 Score 0 0 0  Altered sleeping  0 0  Tired, decreased energy  0 3  Change in appetite  0 0  Feeling bad or failure about yourself   0 0  Trouble concentrating  0 0  Moving slowly or fidgety/restless  0 0  Suicidal thoughts  0 0  PHQ-9 Score  0 3  Difficult doing work/chores   Not difficult at all    Relevant past medical, surgical, family and social history reviewed and updated as indicated. Interim medical history since our last visit reviewed. Allergies and medications reviewed and updated.  Review of  Systems  Ten systems reviewed and is negative except as mentioned in HPI      Objective:     BP 102/66   Pulse 84   Resp 16   Ht 5' 3 (1.6 m)   Wt 140 lb 8 oz (63.7 kg)   SpO2 97%   Breastfeeding No   BMI 24.89 kg/m    Wt Readings from Last 3 Encounters:  11/07/23 140 lb 8 oz (63.7 kg)  11/03/23 141 lb (64 kg)  07/06/23 146 lb 3.2 oz (66.3 kg)    Physical Exam Physical Exam GENERAL: Alert, cooperative, well developed, no acute distress HEENT: Normocephalic, normal oropharynx, moist mucous membranes CHEST: Clear to auscultation bilaterally, No wheezes, rhonchi, or crackles CARDIOVASCULAR: Normal heart rate and rhythm, S1 and S2 normal without murmurs ABDOMEN: Soft, non-tender, non-distended, without organomegaly, Normal bowel sounds EXTREMITIES: No cyanosis or edema NEUROLOGICAL: Cranial nerves grossly intact, Moves all extremities without gross motor or sensory deficit   Results for orders placed or performed in visit on 11/03/23  POCT urine pregnancy   Collection Time: 11/03/23 10:59 AM  Result Value Ref Range   Preg Test, Ur Negative Negative  Assessment & Plan:   Problem List Items Addressed This Visit   None Visit Diagnoses       Vaginal spotting    -  Primary   Relevant Orders   Ambulatory referral to Gynecology   CBC with Differential/Platelet   TSH   Cervicovaginal ancillary only        Assessment and Plan Assessment & Plan Vaginal bleeding associated with intrauterine device (IUD) Intermittent light red vaginal bleeding likely due to the aging Mirena  IUD, which has been in place for approximately five years. The IUD may need replacement as it is approaching the end of its effective lifespan. She has heavy menstrual bleeding prior to IUD placement, which may contribute to the current symptoms. Differential diagnosis includes potential infection or other gynecological issues. - Order blood work to rule out other causes of bleeding. -  Refer to gynecologist for IUD evaluation and potential replacement. - Instruct her to perform a vaginal swab to check for infection.  Evaluation for vaginal or cervical infection Evaluation for potential vaginal or cervical infection as a cause of bleeding. She expressed concern about the possibility of cancer, but previous Pap smear in 2023 was negative for HPV and showed only some abnormal cells, with a recommendation to repeat in three years. - Perform vaginal swab to check for infection. - Refer to gynecologist for Pap smear and further evaluation.        Follow up plan: Return if symptoms worsen or fail to improve.

## 2023-11-07 NOTE — Patient Instructions (Signed)
 Vaginal swab done to check for infection Previous pregnancy test done and was negative Getting cbc to check blood counts Getting tsh to check thyroid Referral placed to Gynecology for pap and IUD change

## 2023-11-08 ENCOUNTER — Ambulatory Visit: Payer: Self-pay | Admitting: Nurse Practitioner

## 2023-11-08 DIAGNOSIS — B9689 Other specified bacterial agents as the cause of diseases classified elsewhere: Secondary | ICD-10-CM

## 2023-11-08 LAB — CERVICOVAGINAL ANCILLARY ONLY
Bacterial Vaginitis (gardnerella): POSITIVE — AB
Candida Glabrata: NEGATIVE
Candida Vaginitis: NEGATIVE
Chlamydia: NEGATIVE
Comment: NEGATIVE
Comment: NEGATIVE
Comment: NEGATIVE
Comment: NEGATIVE
Comment: NEGATIVE
Comment: NORMAL
Neisseria Gonorrhea: NEGATIVE
Trichomonas: NEGATIVE

## 2023-11-08 MED ORDER — METRONIDAZOLE 500 MG PO TABS
500.0000 mg | ORAL_TABLET | Freq: Two times a day (BID) | ORAL | 0 refills | Status: AC
Start: 2023-11-08 — End: 2023-11-15

## 2023-11-22 ENCOUNTER — Ambulatory Visit
Admission: RE | Admit: 2023-11-22 | Discharge: 2023-11-22 | Disposition: A | Discharge status: Home / Self Care | Source: Ambulatory Visit | Attending: Physician Assistant | Admitting: Physician Assistant

## 2023-11-22 ENCOUNTER — Other Ambulatory Visit: Payer: Self-pay | Admitting: Physician Assistant

## 2023-11-22 DIAGNOSIS — N644 Mastodynia: Secondary | ICD-10-CM | POA: Diagnosis not present

## 2023-11-22 DIAGNOSIS — E782 Mixed hyperlipidemia: Secondary | ICD-10-CM

## 2023-11-22 DIAGNOSIS — Z1231 Encounter for screening mammogram for malignant neoplasm of breast: Secondary | ICD-10-CM | POA: Insufficient documentation

## 2023-11-22 DIAGNOSIS — R7303 Prediabetes: Secondary | ICD-10-CM

## 2023-11-22 DIAGNOSIS — J302 Other seasonal allergic rhinitis: Secondary | ICD-10-CM

## 2023-11-25 ENCOUNTER — Ambulatory Visit: Payer: Self-pay | Admitting: Physician Assistant

## 2023-11-25 NOTE — Progress Notes (Signed)
 Screening mammogram is negative, in a year

## 2023-11-28 ENCOUNTER — Ambulatory Visit: Admitting: Family Medicine

## 2023-12-21 ENCOUNTER — Encounter: Admitting: Certified Nurse Midwife

## 2023-12-23 ENCOUNTER — Encounter: Payer: Self-pay | Admitting: Certified Nurse Midwife

## 2024-01-02 ENCOUNTER — Ambulatory Visit: Admitting: Physician Assistant

## 2024-01-02 NOTE — Progress Notes (Deleted)
 Established patient visit  Patient: Deanna Hutchinson   DOB: 05-24-1980   43 y.o. Female  MRN: [id number] Visit Date: 01/02/2024  Today's healthcare provider: Jolynn Spencer, PA-C   No chief complaint on file.  Subjective       Discussed the use of AI scribe software for clinical note transcription with the patient, who gave verbal consent to proceed.  History of Present Illness        11/07/2023    9:49 AM 07/06/2023    8:41 AM 01/27/2023   11:00 AM  Depression screen PHQ 2/9  Decreased Interest 0 0 0  Down, Depressed, Hopeless 0 0 0  PHQ - 2 Score 0 0 0  Altered sleeping  0 0  Tired, decreased energy  0 3  Change in appetite  0 0  Feeling bad or failure about yourself   0 0  Trouble concentrating  0 0  Moving slowly or fidgety/restless  0 0  Suicidal thoughts  0 0  PHQ-9 Score  0 3  Difficult doing work/chores   Not difficult at all      07/06/2023    8:41 AM 01/27/2023   11:00 AM  GAD 7 : Generalized Anxiety Score  Nervous, Anxious, on Edge 0 0  Control/stop worrying 0 0  Worry too much - different things 0 0  Trouble relaxing 0 0  Restless 0 0  Easily annoyed or irritable 0 0  Afraid - awful might happen 0 0  Total GAD 7 Score 0 0  Anxiety Difficulty  Not difficult at all    Medications: Outpatient Medications Prior to Visit  Medication Sig  . fexofenadine  (ALLEGRA  ALLERGY) 180 MG tablet Take 1 tablet (180 mg total) by mouth daily. (Patient not taking: Reported on 11/07/2023)  . fluticasone  (FLONASE ) 50 MCG/ACT nasal spray PLACE 1 SPRAY INTO BOTH NOSTRILS IN THE MORNING AND AT BEDTIME. (Patient not taking: Reported on 11/07/2023)  . levonorgestrel  (MIRENA ) 20 MCG/24HR IUD 1 each by Intrauterine route once. (Patient not taking: Reported on 11/07/2023)  . mometasone  (ELOCON ) 0.1 % cream Apply 1 Application topically daily. (Patient not taking: Reported on 11/07/2023)  . Ruxolitinib Phosphate (OPZELURA) 1.5 % CREA Apply topically. (Patient not taking: Reported on 11/07/2023)    No facility-administered medications prior to visit.    Review of Systems  All other systems reviewed and are negative.  All negative Except see HPI   {Insert previous labs (optional):23779} {See past labs  Heme  Chem  Endocrine  Serology  Results Review (optional):1}   Objective    There were no vitals taken for this visit. {Insert last BP/Wt (optional):23777}{See vitals history (optional):1}   Physical Exam Vitals reviewed.  Constitutional:      General: She is not in acute distress.    Appearance: Normal appearance. She is well-developed. She is not diaphoretic.  HENT:     Head: Normocephalic and atraumatic.  Eyes:     General: No scleral icterus.    Conjunctiva/sclera: Conjunctivae normal.  Neck:     Thyroid : No thyromegaly.  Cardiovascular:     Rate and Rhythm: Normal rate and regular rhythm.     Pulses: Normal pulses.     Heart sounds: Normal heart sounds. No murmur heard. Pulmonary:     Effort: Pulmonary effort is normal. No respiratory distress.     Breath sounds: Normal breath sounds. No wheezing, rhonchi or rales.  Musculoskeletal:     Cervical back: Neck supple.     Right lower leg: No  edema.     Left lower leg: No edema.  Lymphadenopathy:     Cervical: No cervical adenopathy.  Skin:    General: Skin is warm and dry.     Findings: No rash.  Neurological:     Mental Status: She is alert and oriented to person, place, and time. Mental status is at baseline.  Psychiatric:        Mood and Affect: Mood normal.        Behavior: Behavior normal.     No results found for any visits on 01/02/24.      Assessment and Plan Assessment & Plan     No orders of the defined types were placed in this encounter.   No follow-ups on file.   The patient was advised to call back or seek an in-person evaluation if the symptoms worsen or if the condition fails to improve as anticipated.  I discussed the assessment and treatment plan with the patient. The  patient was provided an opportunity to ask questions and all were answered. The patient agreed with the plan and demonstrated an understanding of the instructions.  I, Mohamedamin Nifong, PA-C have reviewed all documentation for this visit. The documentation on 01/02/2024  for the exam, diagnosis, procedures, and orders are all accurate and complete.  Jolynn Spencer, [facility], MMS [facility] [phone] (phone) [phone] (fax)  [facility]

## 2024-02-06 ENCOUNTER — Ambulatory Visit: Payer: Self-pay

## 2024-02-06 NOTE — Telephone Encounter (Signed)
 Will evaluate during visit, however if any concern that there was a fracture - I would recommend she be evaluated in the orthopedic urgent care today instead of waiting for the scheduled visit tomorrow

## 2024-02-06 NOTE — Telephone Encounter (Signed)
 FYI Only or Action Required?: FYI only for provider.  Patient was last seen in primary care on 11/07/2023 by Deanna Mliss FALCON, FNP.  Called Nurse Triage reporting Hand Pain.  Symptoms began about a month ago.  Interventions attempted: Nothing.  Symptoms are: unchanged.  Triage Disposition: See Physician Within 24 Hours  Patient/caregiver understands and will follow disposition?: Yes    Copied from CRM #[id number]. Topic: Clinical - Red Word Triage >> Feb 06, 2024 11:07 AM Deanna Hutchinson wrote: [facility] that prompted transfer to Nurse Triage: Patient reports she cut her finger about 1 month ago, it didn'Hutchinson heal properly, and now is having pain in left hand. Reason for Disposition  Looks infected (spreading redness, pus)  Answer Assessment - Initial Assessment Questions 1. ONSET: When did the pain start?      1 month ago when injury happened.  2. LOCATION and RADIATION: Where is the pain located?  (e.g., fingertip, around nail, joint, entire      Left hand and left index finger 3. SEVERITY: How bad is the pain? What does it keep you from doing?   (Scale 1-10; or mild, moderate, severe)     8/10 4. APPEARANCE: What does the finger look like? (e.g., redness, swelling, bruising, pallor)     Wound is closed 5. WORK OR EXERCISE: Has there been any recent work or exercise that involved this part (i.e., fingers or hand) of the body?     Cut finger a month ago 6. CAUSE: What do you think is causing the pain?     Old cut 7. AGGRAVATING FACTORS: What makes the pain worse? (e.g., using computer)     Using it 8. OTHER SYMPTOMS: Do you have any other symptoms? (e.g., fever, neck pain, numbness)     denies 9. PREGNANCY: Is there any chance you are pregnant? When was your last menstrual period?     na  Protocols used: Finger Pain-A-AH

## 2024-02-07 ENCOUNTER — Encounter: Payer: Self-pay | Admitting: Family Medicine

## 2024-02-07 ENCOUNTER — Ambulatory Visit (INDEPENDENT_AMBULATORY_CARE_PROVIDER_SITE_OTHER): Admitting: Family Medicine

## 2024-02-07 VITALS — BP 103/74 | HR 68 | Temp 98.1°F | Ht 63.0 in | Wt 140.7 lb

## 2024-02-07 DIAGNOSIS — L03019 Cellulitis of unspecified finger: Secondary | ICD-10-CM | POA: Diagnosis not present

## 2024-02-07 DIAGNOSIS — M62838 Other muscle spasm: Secondary | ICD-10-CM | POA: Diagnosis not present

## 2024-02-07 MED ORDER — CEPHALEXIN 500 MG PO CAPS: 500.0000 mg | ORAL_CAPSULE | Freq: Three times a day (TID) | ORAL | 0 refills | Status: AC

## 2024-02-07 NOTE — Patient Instructions (Signed)
 To keep you healthy, please keep in mind the following health maintenance items that you are due for:   Health Maintenance Due  Topic Date Due   HPV VACCINES (1 - 3-dose SCDM series) Never done     Best Wishes,   Dr. Lang
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
## 2024-02-08 NOTE — Progress Notes (Unsigned)
   GYN ENCOUNTER  Subjective  HPI: Deanna Hutchinson is a 43 y.o. [id number] who presents today for spotting. She has had a Mirena  IUD since 10/2018. She reports that she has 1-2 days of light spotting every month or two. She denies itching, odor, or discomfort. She has occasional breast tenderness. She had a normal mammogram recently. She pelvic pain, UTI, dyspareunia, abnormal bleeding, and other abnormal discharge. Her husband is concerned and asked her to be seen because his mother passed away from endometrial cancer.  Past Medical History:  Diagnosis Date   Vaginal Pap smear, abnormal    Past Surgical History:  Procedure Laterality Date   BREAST ENHANCEMENT SURGERY Bilateral    BREAST SURGERY     NOSE SURGERY     OB History     Gravida  3   Para  3   Term  3   Preterm  0   AB  0   Living  3      SAB  0   IAB  0   Ectopic  0   Multiple  0   Live Births  3          No Known Allergies  ROS: See HPI  Objective  BP 98/68   Pulse 65   Ht 5' 3 (1.6 m)   Wt 140 lb (63.5 kg)   BMI 24.80 kg/m   Physical examination Declines  Assessment -Occasional spotting with IUD  Plan -Reassured that this is a normal finding with an IUD in place and that occasional cyclic asymptomatic spotting in a premenopausal woman is not a sign of endometrial cancer. Reviewed s/s of endometrial cancer in a premenopausal woman and what symptoms would warrant further workup. Reviewed timing of preventive care screenings with Deanna Hutchinson and her husband. -Last Pap was ACSUS with negative HPV in 02/2022. Will schedule annual with Pap for next year per [facility]P guidelines.  Keimon Basaldua, CNM

## 2024-02-10 ENCOUNTER — Encounter: Payer: Self-pay | Admitting: Obstetrics

## 2024-02-10 ENCOUNTER — Ambulatory Visit (INDEPENDENT_AMBULATORY_CARE_PROVIDER_SITE_OTHER): Payer: Self-pay | Admitting: Obstetrics

## 2024-02-10 VITALS — BP 98/68 | HR 65 | Ht 63.0 in | Wt 140.0 lb

## 2024-02-10 DIAGNOSIS — Z7689 Persons encountering health services in other specified circumstances: Secondary | ICD-10-CM

## 2024-02-10 DIAGNOSIS — N939 Abnormal uterine and vaginal bleeding, unspecified: Secondary | ICD-10-CM

## 2024-02-10 DIAGNOSIS — Z975 Presence of (intrauterine) contraceptive device: Secondary | ICD-10-CM
